# Patient Record
Sex: Female | Born: 1960
Health system: Southern US, Community
[De-identification: ages and names within clinical notes are randomized; demographics above are authoritative.]

## PROBLEM LIST (undated history)

## (undated) DIAGNOSIS — E119 Type 2 diabetes mellitus without complications: Secondary | ICD-10-CM

## (undated) DIAGNOSIS — K219 Gastro-esophageal reflux disease without esophagitis: Secondary | ICD-10-CM

## (undated) DIAGNOSIS — I422 Other hypertrophic cardiomyopathy: Secondary | ICD-10-CM

## (undated) DIAGNOSIS — I10 Essential (primary) hypertension: Secondary | ICD-10-CM

## (undated) DIAGNOSIS — E785 Hyperlipidemia, unspecified: Secondary | ICD-10-CM

## (undated) HISTORY — DX: Gastro-esophageal reflux disease without esophagitis: K21.9

## (undated) HISTORY — DX: Other hypertrophic cardiomyopathy: I42.2

## (undated) HISTORY — DX: Type 2 diabetes mellitus without complications: E11.9

## (undated) HISTORY — DX: Essential (primary) hypertension: I10

## (undated) HISTORY — PX: NO PAST SURGERIES: SHX2092

## (undated) HISTORY — DX: Hyperlipidemia, unspecified: E78.5

---

## 2003-09-21 ENCOUNTER — Encounter (INDEPENDENT_AMBULATORY_CARE_PROVIDER_SITE_OTHER): Payer: Self-pay | Admitting: Cardiology

## 2003-09-21 ENCOUNTER — Ambulatory Visit (HOSPITAL_COMMUNITY): Admission: RE | Admit: 2003-09-21 | Discharge: 2003-09-21 | Payer: Self-pay | Admitting: Family Medicine

## 2004-04-06 ENCOUNTER — Ambulatory Visit (HOSPITAL_COMMUNITY): Admission: RE | Admit: 2004-04-06 | Discharge: 2004-04-06 | Payer: Self-pay | Admitting: Family Medicine

## 2006-02-15 ENCOUNTER — Ambulatory Visit (HOSPITAL_COMMUNITY): Admission: RE | Admit: 2006-02-15 | Discharge: 2006-02-15 | Payer: Self-pay | Admitting: Family Medicine

## 2009-04-01 ENCOUNTER — Emergency Department: Payer: Self-pay | Admitting: Emergency Medicine

## 2009-04-04 ENCOUNTER — Ambulatory Visit (HOSPITAL_COMMUNITY): Admission: RE | Admit: 2009-04-04 | Discharge: 2009-04-04 | Payer: Self-pay | Admitting: Family Medicine

## 2009-09-12 ENCOUNTER — Ambulatory Visit (HOSPITAL_COMMUNITY): Admission: RE | Admit: 2009-09-12 | Discharge: 2009-09-12 | Payer: Self-pay | Admitting: Family Medicine

## 2009-09-15 ENCOUNTER — Encounter: Admission: RE | Admit: 2009-09-15 | Discharge: 2009-09-15 | Payer: Self-pay | Admitting: Family Medicine

## 2010-10-22 ENCOUNTER — Encounter: Payer: Self-pay | Admitting: Family Medicine

## 2011-09-27 ENCOUNTER — Other Ambulatory Visit (HOSPITAL_COMMUNITY): Payer: Self-pay | Admitting: Family Medicine

## 2011-09-27 DIAGNOSIS — Z139 Encounter for screening, unspecified: Secondary | ICD-10-CM

## 2011-09-27 DIAGNOSIS — Z01419 Encounter for gynecological examination (general) (routine) without abnormal findings: Secondary | ICD-10-CM

## 2011-10-01 ENCOUNTER — Ambulatory Visit (HOSPITAL_COMMUNITY): Payer: Self-pay

## 2011-10-03 ENCOUNTER — Telehealth: Payer: Self-pay

## 2011-10-03 NOTE — Telephone Encounter (Signed)
Called, [facility] for pt to return call. (Note on referral said pt was to be scheduled after 03/07/2012 for colonoscopy.

## 2011-10-08 NOTE — Telephone Encounter (Signed)
Pt left VM to call [phone] to speak with her and schedule colonoscopy. Called, [facility] to call.

## 2011-10-10 NOTE — Telephone Encounter (Signed)
Letter mailed for pt to call.  

## 2011-10-18 ENCOUNTER — Ambulatory Visit (HOSPITAL_COMMUNITY)
Admission: RE | Admit: 2011-10-18 | Discharge: 2011-10-18 | Disposition: A | Discharge status: Home / Self Care | Payer: 59 | Source: Ambulatory Visit | Attending: Family Medicine | Admitting: Family Medicine

## 2011-10-18 DIAGNOSIS — Z01419 Encounter for gynecological examination (general) (routine) without abnormal findings: Secondary | ICD-10-CM

## 2011-10-18 DIAGNOSIS — Z139 Encounter for screening, unspecified: Secondary | ICD-10-CM

## 2011-10-18 DIAGNOSIS — Z1231 Encounter for screening mammogram for malignant neoplasm of breast: Secondary | ICD-10-CM | POA: Insufficient documentation

## 2012-01-13 ENCOUNTER — Emergency Department (HOSPITAL_COMMUNITY): Payer: 59

## 2012-01-13 ENCOUNTER — Encounter (HOSPITAL_COMMUNITY): Payer: Self-pay | Admitting: *Deleted

## 2012-01-13 ENCOUNTER — Emergency Department (HOSPITAL_COMMUNITY)
Admission: EM | Admit: 2012-01-13 | Discharge: 2012-01-13 | Disposition: A | Discharge status: Home / Self Care | Payer: 59 | Attending: Emergency Medicine | Admitting: Emergency Medicine

## 2012-01-13 DIAGNOSIS — R000 Tachycardia, unspecified: Secondary | ICD-10-CM

## 2012-01-13 DIAGNOSIS — M545 Low back pain, unspecified: Secondary | ICD-10-CM | POA: Insufficient documentation

## 2012-01-13 DIAGNOSIS — B349 Viral infection, unspecified: Secondary | ICD-10-CM

## 2012-01-13 DIAGNOSIS — B9789 Other viral agents as the cause of diseases classified elsewhere: Secondary | ICD-10-CM | POA: Insufficient documentation

## 2012-01-13 DIAGNOSIS — Z79899 Other long term (current) drug therapy: Secondary | ICD-10-CM | POA: Insufficient documentation

## 2012-01-13 DIAGNOSIS — E860 Dehydration: Secondary | ICD-10-CM | POA: Insufficient documentation

## 2012-01-13 DIAGNOSIS — R509 Fever, unspecified: Secondary | ICD-10-CM | POA: Insufficient documentation

## 2012-01-13 DIAGNOSIS — R51 Headache: Secondary | ICD-10-CM | POA: Insufficient documentation

## 2012-01-13 DIAGNOSIS — I10 Essential (primary) hypertension: Secondary | ICD-10-CM | POA: Insufficient documentation

## 2012-01-13 DIAGNOSIS — R011 Cardiac murmur, unspecified: Secondary | ICD-10-CM | POA: Insufficient documentation

## 2012-01-13 DIAGNOSIS — R109 Unspecified abdominal pain: Secondary | ICD-10-CM | POA: Insufficient documentation

## 2012-01-13 LAB — DIFFERENTIAL
Basophils Absolute: 0 10*3/uL (ref 0.0–0.1)
Eosinophils Absolute: 0 10*3/uL (ref 0.0–0.7)
Lymphocytes Relative: 5 % — ABNORMAL LOW (ref 12–46)
Monocytes Absolute: 0.2 10*3/uL (ref 0.1–1.0)
Monocytes Relative: 2 % — ABNORMAL LOW (ref 3–12)
Neutrophils Relative %: 93 % — ABNORMAL HIGH (ref 43–77)

## 2012-01-13 LAB — COMPREHENSIVE METABOLIC PANEL
GFR calc Af Amer: 80 mL/min — ABNORMAL LOW (ref >90)
Glucose, Bld: 138 mg/dL — ABNORMAL HIGH (ref 70–99)
Potassium: 2.9 mEq/L — ABNORMAL LOW (ref 3.5–5.1)
Total Bilirubin: 0.5 mg/dL (ref 0.3–1.2)

## 2012-01-13 LAB — URINALYSIS, ROUTINE W REFLEX MICROSCOPIC
Hgb urine dipstick: NEGATIVE
Ketones, ur: NEGATIVE mg/dL
Leukocytes, UA: NEGATIVE

## 2012-01-13 LAB — TSH: TSH: 1.625 u[IU]/mL (ref 0.350–4.500)

## 2012-01-13 LAB — POCT I-STAT, CHEM 8
Calcium, Ion: 1.13 mmol/L (ref 1.12–1.32)
Chloride: 97 mEq/L (ref 96–112)
Glucose, Bld: 142 mg/dL — ABNORMAL HIGH (ref 70–99)
HCT: 45 % (ref 36.0–46.0)
Hemoglobin: 15.3 g/dL — ABNORMAL HIGH (ref 12.0–15.0)

## 2012-01-13 LAB — CBC
HCT: 40.3 % (ref 36.0–46.0)
Hemoglobin: 14.1 g/dL (ref 12.0–15.0)
MCV: 81.3 fL (ref 78.0–100.0)
RBC: 4.96 MIL/uL (ref 3.87–5.11)
WBC: 8.5 10*3/uL (ref 4.0–10.5)

## 2012-01-13 LAB — CK TOTAL AND CKMB (NOT AT ARMC)
CK, MB: 2.3 ng/mL (ref 0.3–4.0)
Relative Index: INVALID (ref 0.0–2.5)

## 2012-01-13 LAB — D-DIMER, QUANTITATIVE: D-Dimer, Quant: 2.56 ug/mL-FEU — ABNORMAL HIGH (ref 0.00–0.48)

## 2012-01-13 LAB — WET PREP, GENITAL: Trich, Wet Prep: NONE SEEN

## 2012-01-13 MED ORDER — HYDROMORPHONE HCL PF 1 MG/ML IJ SOLN
1.0000 mg | Freq: Once | INTRAMUSCULAR | Status: AC
  Administered 2012-01-13: 1 mg via INTRAVENOUS
  Filled 2012-01-13: qty 1

## 2012-01-13 MED ORDER — ACETAMINOPHEN 325 MG PO TABS
650.0000 mg | ORAL_TABLET | Freq: Once | ORAL | Status: AC
  Administered 2012-01-13: 650 mg via ORAL
  Filled 2012-01-13: qty 2

## 2012-01-13 MED ORDER — POTASSIUM CHLORIDE CRYS ER 20 MEQ PO TBCR
40.0000 meq | EXTENDED_RELEASE_TABLET | Freq: Once | ORAL | Status: AC
  Administered 2012-01-13: 40 meq via ORAL
  Filled 2012-01-13: qty 2

## 2012-01-13 MED ORDER — IOHEXOL 350 MG/ML SOLN
80.0000 mL | Freq: Once | INTRAVENOUS | Status: AC | PRN
  Administered 2012-01-13: 80 mL via INTRAVENOUS

## 2012-01-13 MED ORDER — KETOROLAC TROMETHAMINE 30 MG/ML IJ SOLN
30.0000 mg | Freq: Once | INTRAMUSCULAR | Status: AC
  Administered 2012-01-13: 30 mg via INTRAVENOUS
  Filled 2012-01-13: qty 1

## 2012-01-13 MED ORDER — SODIUM CHLORIDE 0.9 % IV BOLUS (SEPSIS)
500.0000 mL | Freq: Once | INTRAVENOUS | Status: AC
  Administered 2012-01-13: 500 mL via INTRAVENOUS

## 2012-01-13 MED ORDER — ONDANSETRON HCL 4 MG/2ML IJ SOLN
4.0000 mg | Freq: Once | INTRAMUSCULAR | Status: AC
  Administered 2012-01-13: 4 mg via INTRAVENOUS
  Filled 2012-01-13: qty 2

## 2012-01-13 MED ORDER — ONDANSETRON 8 MG PO TBDP: 8.0000 mg | ORAL_TABLET | Freq: Three times a day (TID) | ORAL | Status: AC | PRN

## 2012-01-13 MED ORDER — SODIUM CHLORIDE 0.9 % IV BOLUS (SEPSIS)
1000.0000 mL | INTRAVENOUS | Status: AC
  Administered 2012-01-13: 1000 mL via INTRAVENOUS

## 2012-01-13 MED ORDER — ALBUTEROL SULFATE (5 MG/ML) 0.5% IN NEBU: 5.0000 mg | INHALATION_SOLUTION | RESPIRATORY_TRACT | Status: DC | PRN

## 2012-01-13 MED ORDER — ACETAMINOPHEN 325 MG PO TABS: 650.0000 mg | ORAL_TABLET | Freq: Once | ORAL | Status: DC

## 2012-01-13 MED ORDER — SODIUM CHLORIDE 0.9 % IV SOLN
INTRAVENOUS | Status: DC
  Administered 2012-01-13: 125 mL/h via INTRAVENOUS

## 2012-01-13 MED ORDER — IOHEXOL 300 MG/ML  SOLN
100.0000 mL | Freq: Once | INTRAMUSCULAR | Status: AC | PRN
  Administered 2012-01-13: 100 mL via INTRAVENOUS

## 2012-01-13 MED ORDER — POTASSIUM CHLORIDE 10 MEQ/100ML IV SOLN
10.0000 meq | Freq: Once | INTRAVENOUS | Status: AC
  Administered 2012-01-13: 10 meq via INTRAVENOUS
  Filled 2012-01-13: qty 100

## 2012-01-13 NOTE — ED Notes (Signed)
The pt has been weak and dizzy since last pm.  She has had chills and aching all over also.  Elevated temp earlier

## 2012-01-13 NOTE — ED Notes (Signed)
Pt with fluids infusing not charted at 250 ml an hour ns.denies complaints or needs. Pt is happy with care thus far in stay hre.

## 2012-01-13 NOTE — ED Provider Notes (Signed)
History     CSN: [id number]  Arrival date & time 01/13/12  0133   First MD Initiated Contact with Patient 01/13/12 0217      Chief Complaint  Patient presents with  . Tachycardia    (Consider location/radiation/quality/duration/timing/severity/associated sxs/prior treatment) HPI  Past Medical History  Diagnosis Date  . Hypertension     History reviewed. No pertinent past surgical history.  No family history on file.  History  Substance Use Topics  . Smoking status: Never Smoker   . Smokeless tobacco: Not on file  . Alcohol Use: No    OB History    Grav Para Term Preterm Abortions TAB SAB Ect Mult Living                  Review of Systems  Allergies  Review of patient's allergies indicates no known allergies.  Home Medications   Current Outpatient Rx  Name Route Sig Dispense Refill  . VITAMIN D 1000 UNITS PO TABS Oral Take 1,000 Units by mouth daily.    . OMEGA-3 FATTY ACIDS 1000 MG PO CAPS Oral Take 1 g by mouth 2 (two) times daily.    Marland Kitchen HYDROCHLOROTHIAZIDE 25 MG PO TABS Oral Take 25 mg by mouth daily.    . TRIVORA (28) PO Oral Take 1 tablet by mouth daily.    Marland Kitchen SIMVASTATIN 10 MG PO TABS Oral Take 10 mg by mouth at bedtime.      BP 120/74  Pulse 120  Temp(Src) 99 F (37.2 C) (Oral)  Resp 18  Ht 5' (1.524 m)  Wt 160 lb (72.576 kg)  BMI 31.25 kg/m2  SpO2 99%  LMP 12/24/2011  Physical Exam  ED Course  Procedures (including critical care time)  Labs Reviewed  DIFFERENTIAL - Abnormal; Notable for the following:    Neutrophils Relative 93 (*)    Neutro Abs 7.8 (*)    Lymphocytes Relative 5 (*)    Lymphs Abs 0.4 (*)    Monocytes Relative 2 (*)    All other components within normal limits  COMPREHENSIVE METABOLIC PANEL - Abnormal; Notable for the following:    Sodium 130 (*)    Potassium 2.9 (*)    Chloride 94 (*)    Glucose, Bld 138 (*)    Albumin 3.1 (*)    GFR calc non Af Amer 69 (*)    GFR calc Af Amer 80 (*)    All other components  within normal limits  POCT I-STAT, CHEM 8 - Abnormal; Notable for the following:    Sodium 132 (*)    Potassium 3.0 (*)    Glucose, Bld 142 (*)    Hemoglobin 15.3 (*)    All other components within normal limits  WET PREP, GENITAL - Abnormal; Notable for the following:    WBC, Wet Prep HPF POC FEW (*)    All other components within normal limits  CBC  CK TOTAL AND CKMB  URINALYSIS, ROUTINE W REFLEX MICROSCOPIC  GC/CHLAMYDIA PROBE AMP, GENITAL  TSH  D-DIMER, QUANTITATIVE   Ct Abdomen Pelvis W Contrast  01/13/2012  *RADIOLOGY REPORT*  Clinical Data: Abdominal pain  CT ABDOMEN AND PELVIS WITH CONTRAST  Technique:  Multidetector CT imaging of the abdomen and pelvis was performed following the standard protocol during bolus administration of intravenous contrast.  Contrast: OMNIPAQUE IOHEXOL 300 MG/ML  SOLN  Comparison: none  Findings: Lung bases are clear.  No pericardial fluid.  No focal hepatic lesion.  The gallbladder, pancreas, spleen, adrenal  glands, and kidneys are normal.  The stomach, small bowel, appendix, and cecum are normal.  The colon and rectosigmoid colon are normal.  Abdominal aorta normal caliber.  There is a left retro aortic renal vein.  No retroperitoneal periportal lymphadenopathy.  No peritoneal disease.  There is a small nodule posterior to the left psoas muscle measuring 5 mm (image 52).  This is of undetermined significance.  No free fluid the pelvis.  The uterus and ovaries are normal.  No pelvic lymphadenopathy. Review of  bone windows demonstrates no aggressive osseous lesions.  IMPRESSION:  1.  No acute abdominal or pelvic findings. 2.  Small retroperitoneal nodule on the left.  In the absence of known malignancy this is of unlikely clinical significance.  Original Report Authenticated By: Genevive Bi, M.D.     1. Viral syndrome   2. Dehydration   3. Tachycardia     12:18 PM Handoff from Dr. Fredricka Bonine from [facility] -- patient worked up overnight for  tachycardia, fluids given. Likely viral syndrome but d-dimer and TSH pending.   Plan: If d-dimer neg, d/c home. If pos, CT angio chest and dispo per findings.   Vital signs reviewed and are as follows: Filed Vitals:   01/13/12 1200  BP: 120/74  Pulse: 120  Temp:   Resp: 18   12:42 PM D-dimer elevated. CT angio ordered. Will hydrate. Discussed with Dr. Fredricka Bonine given previous dye load.   2:56 PM CT scan reviewed by myself. No pulmonary embolism. Patient informed of results. Patient states she feels better with the exception of a headache. Patient continues to be tachycardic between 120 and 130. I discussed this with Dr. Fredricka Bonine. We discussed that dangerous etiology of tachycardia has been reasonably evaluated and the patient can follow up with her primary care physician.   Patient counseled on supportive care for symptoms and s/s to return including worsening symptoms, persistent fever, persistent vomiting, chest pain, shortness of breath or if they have any other concerns.  Urged to see PCP in one to 2 days for reevaluation. Patient verbalizes understanding and agrees with plan.    MDM  Patients with sinus tachycardia, generalized weakness and muscle aches. CT scan of abdomen is negative. CT scan of chest shows no PE. Blood tests suggest mild dehydration. Patient has received fluids in emergency department. TSH is pending. Clinically the patient states she is improved. She is requesting discharge home. Patient appears well and has been stable at time of discharge.        [place], [place] 01/13/12 [phone]

## 2012-01-13 NOTE — Discharge Instructions (Signed)
Please read and follow all provided instructions.  Your diagnoses today include:  1. Viral syndrome   2. Dehydration     Tests performed today include:  Blood tests showed mild dehydration  CT scan of your abdomen that did not show any concerning causes of your symptoms  CT scan of your chest that did not show any blood clots  Thyroid test that is still pending  Vital signs. See below for your results today.   Medications prescribed:   Zofran (ondansetron) - for nausea and vomiting  Take any prescribed medications only as directed.  Home care instructions:  Follow any educational materials contained in this packet.  Use motrin or aleve as directed on packaging for body aches.  Double your fluid intake for the next several days.  Follow-up instructions: Please follow-up with your primary care provider in the next 1-2 days for further evaluation of your symptoms. If you do not have a primary care doctor -- see below for referral information.   Return instructions:   Please return to the Emergency Department if you experience worsening symptoms.   Return with worsening chest pain, shortness of breath, persistent vomiting, persistent high fever.  Please return if you have any other emergent concerns.  Additional Information:  Your vital signs today were: BP 122/64  Pulse 125  Temp(Src) 99 F (37.2 C) (Oral)  Resp 16  Ht 5' (1.524 m)  Wt 160 lb (72.576 kg)  BMI 31.25 kg/m2  SpO2 100%  LMP 12/24/2011 If your blood pressure (BP) was elevated above 135/85 this visit, please have this repeated by your doctor within one month. -------------- No Primary Care Doctor Call [facility]  [phone] Other agencies that provide inexpensive medical care    [facility]  [phone]    [facility]  [phone]    [facility]  [phone]    [facility]  [phone]    [facility]  [phone]    [facility]   [phone] -------------- RESOURCE GUIDE:  Dental Problems  Patients with Medicaid: [facility] [phone] [address]                                            [phone] Phone:  [phone]                                                   Phone:  [phone]  If unable to pay or uninsured, contact:  [facility] or [facility]. to become qualified for the adult dental clinic.  Chronic Pain Problems Contact [facility]  [phone] Patients need to be referred by their primary care doctor.  Insufficient Money for Medicine Contact [employer]:  call "211" or [facility] [phone].  Psychological Services [facility]  [phone] [facility]  [phone] [facility]   [phone] (emergency services [phone])  Substance Abuse Resources [facility]  [phone] [facility] [phone] [facility] [phone] Floydene Flock [phone] Residential & Outpatient Substance Abuse Program  [phone]  Abuse/Neglect [facility] Child Abuse Hotline [phone] [facility] Child Abuse Hotline [phone] (After Hours)  Emergency Shelter [facility] [phone]  Maternity Homes [facility] at the [place] [facility] [place] [phone] [facility] [phone]  [facility]  [facility] [place]     [employer]                          [facility] Dept. [address] [place]                       [address]      [address]  Blondell Reveal Phone:  [phone]                                   Phone:  [phone]                 Phone:  [phone]  [facility] Phone:  [phone]  [facility] Child Abuse Hotline [phone] [phone] (After Hours)

## 2012-01-13 NOTE — ED Provider Notes (Signed)
History     CSN: [id number]  Arrival date & time 01/13/12  0133   First MD Initiated Contact with Patient 01/13/12 0217      Chief Complaint  Patient presents with  . Tachycardia    (Consider location/radiation/quality/duration/timing/severity/associated sxs/prior treatment) HPI Comments: The patient is a 51 year old female with a history of hypertension who presents with several complaints, chief among them fever, lower abdominal pain and lower back pain with a secondary headache. She admits to having a fever of 101.8 at home. Her symptoms are persistent, or gradual in onset and gradually getting worse. She denies rashes, swelling, diarrhea, dysuria, chest pain or shortness of breath or cough. Her headache is persistent, is not associated with a stiff neck, weakness, numbness or difficulty walking.  The history is provided by the patient and the spouse.    Past Medical History  Diagnosis Date  . Hypertension     History reviewed. No pertinent past surgical history.  No family history on file.  History  Substance Use Topics  . Smoking status: Never Smoker   . Smokeless tobacco: Not on file  . Alcohol Use: No    OB History    Grav Para Term Preterm Abortions TAB SAB Ect Mult Living                  Review of Systems  All other systems reviewed and are negative.    Allergies  Review of patient's allergies indicates no known allergies.  Home Medications   Current Outpatient Rx  Name Route Sig Dispense Refill  . VITAMIN D 1000 UNITS PO TABS Oral Take 1,000 Units by mouth daily.    . OMEGA-3 FATTY ACIDS 1000 MG PO CAPS Oral Take 1 g by mouth 2 (two) times daily.    Marland Kitchen HYDROCHLOROTHIAZIDE 25 MG PO TABS Oral Take 25 mg by mouth daily.    . TRIVORA (28) PO Oral Take 1 tablet by mouth daily.    Marland Kitchen SIMVASTATIN 10 MG PO TABS Oral Take 10 mg by mouth at bedtime.      BP 139/74  Pulse 166  Temp(Src) 99.9 F (37.7 C) (Oral)  Resp 20  SpO2 100%  LMP  12/24/2011  Physical Exam  Nursing note and vitals reviewed. Constitutional: She appears well-developed and well-nourished. No distress.  HENT:  Head: Normocephalic and atraumatic.  Mouth/Throat: Oropharynx is clear and moist. No oropharyngeal exudate.  Eyes: Conjunctivae and EOM are normal. Pupils are equal, round, and reactive to light. Right eye exhibits no discharge. Left eye exhibits no discharge. No scleral icterus.  Neck: Normal range of motion. Neck supple. No JVD present. No thyromegaly present.  Cardiovascular: Regular rhythm and intact distal pulses.  Exam reveals no gallop and no friction rub.   Murmur ( systolic) heard.      Tachycardia present  Pulmonary/Chest: Effort normal and breath sounds normal. No respiratory distress. She has no wheezes. She has no rales.  Abdominal: Soft. Bowel sounds are normal. She exhibits no distension and no mass. There is tenderness ( SP, RLQ , LLQ ttp, no masses, bil CVA ttp, non peritoneal, no upper abd ttp).  Genitourinary:       Pelvic exam: normal external genitalia, vulva, vagina, cervix, uterus and adnexa, VULVA: normal appearing vulva with no masses, tenderness or lesions, VAGINA: normal appearing vagina with normal color and discharge, no lesions, CERVIX: normal appearing cervix without discharge or lesions.  No adnexal masses or tenderness.  No CMT.  No bleeding, mild am't  of vag d/c   Musculoskeletal: Normal range of motion. She exhibits no edema and no tenderness.  Lymphadenopathy:    She has no cervical adenopathy.  Neurological: She is alert. Coordination normal.       Normal speech, EOMI, pupils normal, strenght and sensation normal, no droop.  Skin: Skin is warm and dry. No rash noted. No erythema.  Psychiatric: She has a normal mood and affect. Her behavior is normal.    ED Course  Procedures (including critical care time)  ED ECG REPORT   Date: 01/13/2012   Rate: 130  Rhythm: sinus tachycardia  QRS Axis: normal   Intervals: normal  ST/T Wave abnormalities: normal  Conduction Disutrbances:none  Narrative Interpretation:   Old EKG Reviewed: none available    Labs Reviewed  CBC  DIFFERENTIAL  COMPREHENSIVE METABOLIC PANEL  CK TOTAL AND CKMB  URINALYSIS, ROUTINE W REFLEX MICROSCOPIC   No results found.   No diagnosis found.    MDM  R/o infectiouis source, fever, tachycardia, headache without neuro findings, pain meds ordered, labs.  WBC normal, Na and K low, replaced, UA negative, CT pending  Repeat exam with ongoing mild to moderate SP and RLQ ttp   Change of shift - care signed out to Dr. Luiz Blare, MD 01/13/12 [phone]

## 2012-01-26 NOTE — ED Provider Notes (Signed)
Evaluation and management procedures were performed by the PA/NP/resident physician under my supervision/collaboration.   Felisa Bonier, MD 01/26/12 2026

## 2012-02-11 ENCOUNTER — Ambulatory Visit
Admission: RE | Admit: 2012-02-11 | Discharge: 2012-02-11 | Disposition: A | Discharge status: Home / Self Care | Payer: 59 | Source: Ambulatory Visit | Attending: Family Medicine | Admitting: Family Medicine

## 2012-02-11 ENCOUNTER — Other Ambulatory Visit: Payer: Self-pay | Admitting: Family Medicine

## 2012-02-11 DIAGNOSIS — M1990 Unspecified osteoarthritis, unspecified site: Secondary | ICD-10-CM

## 2012-02-11 DIAGNOSIS — R52 Pain, unspecified: Secondary | ICD-10-CM

## 2014-06-24 ENCOUNTER — Other Ambulatory Visit: Payer: Self-pay

## 2014-06-24 DIAGNOSIS — Z1231 Encounter for screening mammogram for malignant neoplasm of breast: Secondary | ICD-10-CM

## 2014-07-05 ENCOUNTER — Ambulatory Visit
Admission: RE | Admit: 2014-07-05 | Discharge: 2014-07-05 | Disposition: A | Discharge status: Home / Self Care | Payer: BC Managed Care – PPO | Source: Ambulatory Visit

## 2014-07-05 ENCOUNTER — Ambulatory Visit: Payer: 59

## 2014-07-05 DIAGNOSIS — Z1231 Encounter for screening mammogram for malignant neoplasm of breast: Secondary | ICD-10-CM

## 2016-02-08 ENCOUNTER — Other Ambulatory Visit: Payer: Self-pay | Admitting: Family Medicine

## 2016-02-08 DIAGNOSIS — Z1231 Encounter for screening mammogram for malignant neoplasm of breast: Secondary | ICD-10-CM

## 2016-02-21 ENCOUNTER — Ambulatory Visit: Payer: Self-pay

## 2016-03-01 ENCOUNTER — Ambulatory Visit
Admission: RE | Admit: 2016-03-01 | Discharge: 2016-03-01 | Disposition: A | Discharge status: Home / Self Care | Payer: 59 | Source: Ambulatory Visit | Attending: Family Medicine | Admitting: Family Medicine

## 2016-03-01 DIAGNOSIS — Z1231 Encounter for screening mammogram for malignant neoplasm of breast: Secondary | ICD-10-CM

## 2016-09-12 ENCOUNTER — Ambulatory Visit: Payer: 59 | Admitting: Nutrition

## 2016-09-12 ENCOUNTER — Telehealth: Payer: Self-pay | Admitting: Nutrition

## 2016-09-12 NOTE — Telephone Encounter (Signed)
TC to pt's home. Husband said he would contact pt and have her call and reschedule missed appt.

## 2016-09-27 ENCOUNTER — Ambulatory Visit: Payer: 59 | Admitting: Nutrition

## 2016-10-04 ENCOUNTER — Telehealth: Payer: Self-pay | Admitting: Nutrition

## 2016-10-04 NOTE — Telephone Encounter (Signed)
Pt will call back to reschedule when she gets her work schedule next week.

## 2017-01-09 ENCOUNTER — Emergency Department (HOSPITAL_COMMUNITY): Payer: Worker's Compensation

## 2017-01-09 ENCOUNTER — Emergency Department (HOSPITAL_COMMUNITY)
Admission: EM | Admit: 2017-01-09 | Discharge: 2017-01-09 | Disposition: A | Discharge status: Home / Self Care | Payer: Worker's Compensation | Attending: Emergency Medicine | Admitting: Emergency Medicine

## 2017-01-09 ENCOUNTER — Encounter (HOSPITAL_COMMUNITY): Payer: Self-pay | Admitting: Emergency Medicine

## 2017-01-09 DIAGNOSIS — E119 Type 2 diabetes mellitus without complications: Secondary | ICD-10-CM | POA: Insufficient documentation

## 2017-01-09 DIAGNOSIS — Z7984 Long term (current) use of oral hypoglycemic drugs: Secondary | ICD-10-CM | POA: Insufficient documentation

## 2017-01-09 DIAGNOSIS — Y92009 Unspecified place in unspecified non-institutional (private) residence as the place of occurrence of the external cause: Secondary | ICD-10-CM | POA: Insufficient documentation

## 2017-01-09 DIAGNOSIS — Z79899 Other long term (current) drug therapy: Secondary | ICD-10-CM | POA: Insufficient documentation

## 2017-01-09 DIAGNOSIS — S0083XA Contusion of other part of head, initial encounter: Secondary | ICD-10-CM

## 2017-01-09 DIAGNOSIS — Y990 Civilian activity done for income or pay: Secondary | ICD-10-CM | POA: Insufficient documentation

## 2017-01-09 DIAGNOSIS — S0501XA Injury of conjunctiva and corneal abrasion without foreign body, right eye, initial encounter: Secondary | ICD-10-CM | POA: Diagnosis not present

## 2017-01-09 DIAGNOSIS — Y9389 Activity, other specified: Secondary | ICD-10-CM | POA: Diagnosis not present

## 2017-01-09 DIAGNOSIS — I10 Essential (primary) hypertension: Secondary | ICD-10-CM | POA: Insufficient documentation

## 2017-01-09 DIAGNOSIS — S0990XA Unspecified injury of head, initial encounter: Secondary | ICD-10-CM | POA: Diagnosis present

## 2017-01-09 LAB — CBG MONITORING, ED: Glucose-Capillary: 94 mg/dL (ref 65–99)

## 2017-01-09 MED ORDER — FLUORESCEIN SODIUM 0.6 MG OP STRP
ORAL_STRIP | OPHTHALMIC | Status: AC
  Filled 2017-01-09: qty 1

## 2017-01-09 MED ORDER — HYDROCODONE-ACETAMINOPHEN 5-325 MG PO TABS: 1.0000 | ORAL_TABLET | Freq: Once | ORAL | Status: DC

## 2017-01-09 MED ORDER — FLUORESCEIN SODIUM 0.6 MG OP STRP
1.0000 | ORAL_STRIP | Freq: Once | OPHTHALMIC | Status: AC
  Administered 2017-01-09: 1 via OPHTHALMIC
  Filled 2017-01-09: qty 1

## 2017-01-09 MED ORDER — ERYTHROMYCIN 5 MG/GM OP OINT
TOPICAL_OINTMENT | Freq: Four times a day (QID) | OPHTHALMIC | Status: DC
  Administered 2017-01-09: 1 via OPHTHALMIC
  Filled 2017-01-09: qty 3.5

## 2017-01-09 NOTE — ED Notes (Signed)
Pt in waiting area, has been walking around; NAD noted.

## 2017-01-09 NOTE — ED Notes (Signed)
Pt sitting in waiting room speaking on phone, updated on wait.
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
## 2017-01-09 NOTE — Discharge Instructions (Signed)
Take your usual prescriptions as previously directed.  Take over the counter tylenol, as directed on packaging, as needed for discomfort. Apply moist heat or ice several times per day to the area(s) of discomfort, for 20 minutes each time. Do not fall asleep on a heating or ice pack. Call your regular medical doctor tomorrow to schedule a follow up appointment in the next 3 days. Call your eye doctor today to schedule a follow up appointment in the next 24 to 48 hours.  Return to the Emergency Department immediately if worsening.
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
## 2017-07-25 DIAGNOSIS — G4733 Obstructive sleep apnea (adult) (pediatric): Secondary | ICD-10-CM | POA: Diagnosis not present

## 2017-08-01 DIAGNOSIS — I10 Essential (primary) hypertension: Secondary | ICD-10-CM | POA: Diagnosis not present

## 2017-08-01 DIAGNOSIS — Z1389 Encounter for screening for other disorder: Secondary | ICD-10-CM | POA: Diagnosis not present

## 2017-08-01 DIAGNOSIS — E782 Mixed hyperlipidemia: Secondary | ICD-10-CM | POA: Diagnosis not present

## 2017-08-01 DIAGNOSIS — Z23 Encounter for immunization: Secondary | ICD-10-CM | POA: Diagnosis not present

## 2017-08-25 DIAGNOSIS — G4733 Obstructive sleep apnea (adult) (pediatric): Secondary | ICD-10-CM | POA: Diagnosis not present

## 2017-09-04 ENCOUNTER — Ambulatory Visit (INDEPENDENT_AMBULATORY_CARE_PROVIDER_SITE_OTHER): Payer: 59 | Admitting: Cardiology

## 2017-09-04 ENCOUNTER — Encounter: Payer: Self-pay | Admitting: Cardiology

## 2017-09-04 VITALS — BP 108/64 | HR 95 | Ht 60.0 in | Wt 164.2 lb

## 2017-09-04 DIAGNOSIS — R011 Cardiac murmur, unspecified: Secondary | ICD-10-CM | POA: Diagnosis not present

## 2017-09-04 DIAGNOSIS — R9431 Abnormal electrocardiogram [ECG] [EKG]: Secondary | ICD-10-CM | POA: Diagnosis not present

## 2017-09-04 DIAGNOSIS — Z8679 Personal history of other diseases of the circulatory system: Secondary | ICD-10-CM

## 2017-09-04 DIAGNOSIS — E782 Mixed hyperlipidemia: Secondary | ICD-10-CM

## 2017-09-04 DIAGNOSIS — I10 Essential (primary) hypertension: Secondary | ICD-10-CM | POA: Diagnosis not present

## 2017-09-04 NOTE — [place] Notes (Signed)
Cardiology Office Note  Date: 09/04/2017   ID: Beth Li, DOB 07/04/1961, MRN [id number]  PCP: Avis EpleyJackson, Samantha J, PA-C  Consulting Cardiologist: Nona DellSamuel Leya Paige, MD   Chief Complaint  Patient presents with  . Cardiac evaluation    History of Present Illness: Beth OdorCheryl A Nilsson is a 56 y.o. female referred for cardiology consultation by Ms. Jean RosenthalJackson PA-C.  No specific reason for evaluation was given in the records received.  In speaking with the patient today, she states that she was evaluated by Dr. Jacinto HalimGanji about 5 years ago and diagnosed with "heart thickening," also possibly an arrhythmia such as SVT.  She was started on diltiazem at that time.  She states that she has done well in general, but about a month ago when she started metformin, she began to feel more fatigued and short of breath with activities.  This medication was just discontinued and she is waiting to see if symptoms improve.  Otherwise, she has no exertional chest pain, no palpitations or sudden dizziness, no syncope.  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  She does not describe an unusual amount of stress.  States that she feels well in general.  I personally reviewed her ECG today which shows sinus rhythm with anteroseptal Q waves, possible old anteroseptal infarct pattern, no major change compared to remote tracing available.  I reviewed her medications which are outlined below.  Past Medical History:  Diagnosis Date  . Essential hypertension   . GERD (gastroesophageal reflux disease)   . Hyperlipidemia   . Type 2 diabetes mellitus (HCC)     Past Surgical History:  Procedure Laterality Date  . NO PAST SURGERIES      Current Outpatient Medications  Medication Sig Dispense Refill  . amphetamine-dextroamphetamine (ADDERALL) 20 MG tablet Take 20 mg by mouth daily as needed.   0  . aspirin EC 81 MG tablet Take 81 mg by mouth daily.    Marland Kitchen.  atorvastatin (LIPITOR) 40 MG tablet 1 TABLET DAILY ORAL  3  . diltiazem (CARDIZEM CD) 180 MG 24 hr capsule Take 180 mg by mouth 2 (two) times daily.    Marland Kitchen. ibuprofen (ADVIL,MOTRIN) 200 MG tablet Take 600 mg by mouth every 6 (six) hours as needed for mild pain or moderate pain.    Marland Kitchen. lisinopril-hydrochlorothiazide (PRINZIDE,ZESTORETIC) 10-12.5 MG tablet 1 TABLET ONCE EACH DAY ORAL  3  . metFORMIN (GLUCOPHAGE) 500 MG tablet Take 500 mg by mouth 2 (two) times daily with a meal.    . metoprolol tartrate (LOPRESSOR) 25 MG tablet 1 TABLET TWICE DAILY ORAL  3  . traZODone (DESYREL) 50 MG tablet TAKE 1 TABLET BY MOUTH EVERY EVENING AT BEDTIME  5  . zolpidem (AMBIEN) 10 MG tablet Take 10 mg by mouth at bedtime as needed.  5   No current facility-administered medications for this visit.    Allergies:  Patient has no known allergies.   Social History: The patient  reports that  has never smoked. she has never used smokeless tobacco. She reports that she drinks alcohol. She reports that she does not use drugs.   Family History: The patient's family history includes Congestive Heart Failure in her maternal grandmother and sister; Heart attack in her father and paternal grandmother; Heart disease in her maternal grandmother and mother.   ROS:  Please see the history of present illness. Otherwise, complete review of systems is positive for none.  All other systems are reviewed and negative.   Physical Exam: VS:  BP 108/64 (BP Location: Right Arm, Cuff Size: Normal)   Pulse 95   Ht 5' (1.524 m)   Wt 164 lb 3.2 oz (74.5 kg)   LMP 01/10/2012   SpO2 92%   BMI 32.07 kg/m , BMI Body mass index is 32.07 kg/m.  Wt Readings from Last 3 Encounters:  09/04/17 164 lb 3.2 oz (74.5 kg)  01/09/17 161 lb (73 kg)  01/13/12 160 lb (72.6 kg)    General: Overweight woman, appears comfortable at rest. HEENT: Conjunctiva and lids normal, oropharynx clear. Neck: Supple, no elevated JVP or carotid bruits, no  thyromegaly. Lungs: Clear to auscultation, nonlabored breathing at rest. Cardiac: Regular rate and rhythm, no S3, 3/6 basal systolic murmur, no pericardial rub. Abdomen: Soft, nontender, bowel sounds present, no guarding or rebound. Extremities: No pitting edema, distal pulses 2+. Skin: Warm and dry. Musculoskeletal: No kyphosis. Neuropsychiatric: Alert and oriented x3, affect grossly appropriate.  ECG: I personally reviewed the tracing from 01/13/2012 which showed sinus tachycardia with anteroseptal Q waves and ST segment changes, rule out anteroseptal infarct pattern.  Recent Labwork:  November 2018: Cholesterol 157, HDL 60, LDL 79, triglycerides 88, AST 16, ALT 13, TSH 1.01, hemoglobin 11.5, platelets 284, BUN 22, creatinine 0.98, hemoglobin A1c 6.6, potassium 4.1  Assessment and Plan:  1.  Patient referred for cardiac evaluation with possible history of hypertrophic cardiomyopathy and SVT, although no specific cardiac records are available for review as yet.  These are being requested from Dr. Jacinto HalimGanji.  He has a systolic murmur on examination which could go along with a hypertrophic cardiomyopathy, she is not aware of any associated valvular heart disease.  We will plan to obtain an echocardiogram in addition to reviewing her old records.  Based on this, follow-up can be arranged.  At this time no changes are being made to her current medications.  2.  Essential hypertension, blood pressure is very well controlled today on current medications.  3.  Hyperlipidemia, on Lipitor.  Recent LDL 79.  4.  Type 2 diabetes mellitus, recently on Glucophage with possible side effects as outlined above.  She states that this medication was discontinued.  She is being followed by PCP.  Recent hemoglobin A1c 6.6.  Current medicines were reviewed with the patient today.   Orders Placed This Encounter  Procedures  . ECHOCARDIOGRAM COMPLETE    Disposition: Call with test results and follow-up  plan.  Signed, Jonelle SidleSamuel G. Jodel Mayhall, MD, [employer] [facility] LLPFACC 09/04/2017 9:21 AM    [facility] [address], [place], [place] [address] Phone: [phone]; Fax: [phone]

## 2017-09-04 NOTE — Patient Instructions (Signed)
Medication Instructions:  Your physician recommends that you continue on your current medications as directed. Please refer to the Current Medication list given to you today.   Labwork: NONE  Testing/Procedures: Your physician has requested that you have an echocardiogram. Echocardiography is a painless test that uses sound waves to create images of your heart. It provides your doctor with information about the size and shape of your heart and how well your heart's chambers and valves are working. This procedure takes approximately one hour. There are no restrictions for this procedure.    Follow-Up: Your physician recommends that you schedule a follow-up appointment in: TO BE DETERMINED BASED ON TESTS AND RECORDS FROM PREVIOUS CARDIOLOGIST    Any Other Special Instructions Will Be Listed Below (If Applicable).     If you need a refill on your cardiac medications before your next appointment, please call your pharmacy.

## 2017-09-04 NOTE — [place] Notes (Signed)
Records received from Dr. Jacinto HalimGanji regarding previous workup in 2013 in 2014.  History does not include SVT, although she did undergo an echocardiogram in May 2013 that revealed normal LVEF with moderate LVH, mild diastolic dysfunction, and an LVOT gradient of 12 mmHg.  No aortic stenosis was found at that point.  Lexiscan Myoview from 2013 was negative for ischemia.

## 2017-09-05 NOTE — Addendum Note (Signed)
Addended by: Marlyn CorporalARLTON, Solenne Manwarren A on: 09/05/2017 08:17 AM   Modules accepted: Orders

## 2017-09-11 ENCOUNTER — Ambulatory Visit (HOSPITAL_COMMUNITY)
Admission: RE | Admit: 2017-09-11 | Discharge: 2017-09-11 | Disposition: A | Discharge status: Home / Self Care | Payer: 59 | Source: Ambulatory Visit | Attending: Cardiology | Admitting: Cardiology

## 2017-09-11 ENCOUNTER — Telehealth: Payer: Self-pay | Admitting: *Deleted

## 2017-09-11 DIAGNOSIS — I119 Hypertensive heart disease without heart failure: Secondary | ICD-10-CM | POA: Insufficient documentation

## 2017-09-11 DIAGNOSIS — E785 Hyperlipidemia, unspecified: Secondary | ICD-10-CM | POA: Insufficient documentation

## 2017-09-11 DIAGNOSIS — I080 Rheumatic disorders of both mitral and aortic valves: Secondary | ICD-10-CM | POA: Insufficient documentation

## 2017-09-11 DIAGNOSIS — K219 Gastro-esophageal reflux disease without esophagitis: Secondary | ICD-10-CM | POA: Diagnosis not present

## 2017-09-11 DIAGNOSIS — E119 Type 2 diabetes mellitus without complications: Secondary | ICD-10-CM | POA: Diagnosis not present

## 2017-09-11 DIAGNOSIS — R011 Cardiac murmur, unspecified: Secondary | ICD-10-CM | POA: Diagnosis not present

## 2017-09-11 LAB — ECHOCARDIOGRAM COMPLETE
AO mean calculated velocity dopler: 203 cm/s
AOVTI: 72.4 cm
AV pk vel: 309 cm/s
AVG: 20 mmHg
AVPG: 38 mmHg
CHL CUP RV SYS PRESS: 29 mmHg
CHL CUP STROKE VOLUME: 31 mL
CHL CUP TV REG PEAK VELOCITY: 254 cm/s
EERAT: 7.7
EWDT: 556 ms
FS: 60 % — AB (ref 28–44)
IV/PV OW: 1.85
LA diam end sys: 40 mm
LA diam index: 2.21 cm/m2
LA vol A4C: 77.2 ml
LA vol: 61.2 mL
LASIZE: 40 mm
LAVOLIN: 33.9 mL/m2
LDCA: 2.27 cm2
LV E/e'average: 7.7
LV TDI E'LATERAL: 7.18
LV TDI E'MEDIAL: 3.92
LV dias vol index: 25 mL/m2
LVDIAVOL: 45 mL — AB (ref 46–106)
LVEEMED: 7.7
LVELAT: 7.18 cm/s
LVOTD: 17 mm
LVSYSVOL: 14 mL
LVSYSVOLIN: 8 mL/m2
Lateral S' vel: 15.8 cm/s
MV Dec: 556
MV pk A vel: 107 m/s
MV pk E vel: 55.3 m/s
PW: 11.9 mm — AB (ref 0.6–1.1)
RV TAPSE: 27.7 mm
Simpson's disk: 69
TR max vel: 254 cm/s

## 2017-09-11 NOTE — [place] Notes (Signed)
*  PRELIMINARY RESULTS* Echocardiogram 2D Echocardiogram has been performed.  Stacey DrainWhite, Dusten Ellinwood J 09/11/2017, 10:06 AM

## 2017-09-11 NOTE — Telephone Encounter (Signed)
Called patient with test results. No answer. Left message to call back.  

## 2017-09-11 NOTE — Telephone Encounter (Signed)
-----   Message from Jonelle SidleSamuel G McDowell, MD sent at 09/11/2017 11:29 AM EST ----- Results reviewed.  Patient has a hypertrophic cardiomyopathy as suspected at the recent office visit and also review of her previous records.  I reviewed the above report.  Please schedule office follow-up within the next 6 months, sooner if symptoms worsen. A copy of this test should be forwarded to Avis EpleyJackson, Samantha J, PA-C.

## 2017-09-24 DIAGNOSIS — G4733 Obstructive sleep apnea (adult) (pediatric): Secondary | ICD-10-CM | POA: Diagnosis not present

## 2017-10-25 DIAGNOSIS — G4733 Obstructive sleep apnea (adult) (pediatric): Secondary | ICD-10-CM | POA: Diagnosis not present

## 2017-11-21 DIAGNOSIS — E119 Type 2 diabetes mellitus without complications: Secondary | ICD-10-CM | POA: Diagnosis not present

## 2017-11-21 DIAGNOSIS — E782 Mixed hyperlipidemia: Secondary | ICD-10-CM | POA: Diagnosis not present

## 2017-11-21 DIAGNOSIS — I10 Essential (primary) hypertension: Secondary | ICD-10-CM | POA: Diagnosis not present

## 2017-11-25 DIAGNOSIS — J019 Acute sinusitis, unspecified: Secondary | ICD-10-CM | POA: Diagnosis not present

## 2017-11-25 DIAGNOSIS — G4733 Obstructive sleep apnea (adult) (pediatric): Secondary | ICD-10-CM | POA: Diagnosis not present

## 2017-11-25 DIAGNOSIS — E663 Overweight: Secondary | ICD-10-CM | POA: Diagnosis not present

## 2017-11-25 DIAGNOSIS — R6889 Other general symptoms and signs: Secondary | ICD-10-CM | POA: Diagnosis not present

## 2017-11-25 DIAGNOSIS — Z6827 Body mass index (BMI) 27.0-27.9, adult: Secondary | ICD-10-CM | POA: Diagnosis not present

## 2017-12-23 DIAGNOSIS — G4733 Obstructive sleep apnea (adult) (pediatric): Secondary | ICD-10-CM | POA: Diagnosis not present

## 2018-01-23 DIAGNOSIS — G4733 Obstructive sleep apnea (adult) (pediatric): Secondary | ICD-10-CM | POA: Diagnosis not present

## 2018-02-22 DIAGNOSIS — G4733 Obstructive sleep apnea (adult) (pediatric): Secondary | ICD-10-CM | POA: Diagnosis not present

## 2018-04-25 DIAGNOSIS — Z6827 Body mass index (BMI) 27.0-27.9, adult: Secondary | ICD-10-CM | POA: Diagnosis not present

## 2018-04-25 DIAGNOSIS — E663 Overweight: Secondary | ICD-10-CM | POA: Diagnosis not present

## 2018-04-25 DIAGNOSIS — M25562 Pain in left knee: Secondary | ICD-10-CM | POA: Diagnosis not present

## 2018-05-05 DIAGNOSIS — M25562 Pain in left knee: Secondary | ICD-10-CM | POA: Diagnosis not present

## 2018-05-08 ENCOUNTER — Encounter: Payer: Self-pay | Admitting: Gastroenterology

## 2018-05-15 ENCOUNTER — Ambulatory Visit: Payer: Self-pay | Admitting: Cardiology

## 2018-06-05 DIAGNOSIS — Z0001 Encounter for general adult medical examination with abnormal findings: Secondary | ICD-10-CM | POA: Diagnosis not present

## 2018-06-05 DIAGNOSIS — Z0000 Encounter for general adult medical examination without abnormal findings: Secondary | ICD-10-CM | POA: Diagnosis not present

## 2018-06-05 DIAGNOSIS — Z6827 Body mass index (BMI) 27.0-27.9, adult: Secondary | ICD-10-CM | POA: Diagnosis not present

## 2018-06-05 DIAGNOSIS — E663 Overweight: Secondary | ICD-10-CM | POA: Diagnosis not present

## 2018-06-26 NOTE — Progress Notes (Signed)
Cardiology Office Note  Date: 06/27/2018   ID: Beth Li, DOB 04/04/1961, MRN [id number]  PCP: Avis Epley, PA-C  Primary Cardiologist: Nona Dell, MD   Chief Complaint  Patient presents with  . Cardiomyopathy    History of Present Illness: Beth Li is a 57 y.o. female seen in consultation back in December 2018.  She presents for a routine follow-up visit.  She has a hypertrophic cardiomyopathy resulting in heart murmur, confirmed by echocardiogram obtained late last year.  Echocardiogram from December 2018 showed severe LVH with LVEF 65 to 70% and septal asymmetry associated with SAM and dynamic LVOT gradient, overall mild at rest, grade 1 diastolic dysfunction, and severe left atrial enlargement.  States that she has had intermittent shortness of breath with activity.  Heart rate was elevated today when she came in, tends to run a low normal blood pressure as well.  I talked with her about the results of her echocardiogram and also medication adjustments aimed more at heart rate control which should help her symptomatically.  We also discussed maintaining adequate hydration as LVOT gradient will worsen in setting of low volume state.  She has had no syncope or chest pain.  Past Medical History:  Diagnosis Date  . Essential hypertension   . GERD (gastroesophageal reflux disease)   . Hyperlipidemia   . Type 2 diabetes mellitus (HCC)     Past Surgical History:  Procedure Laterality Date  . NO PAST SURGERIES      Current Outpatient Medications  Medication Sig Dispense Refill  . amphetamine-dextroamphetamine (ADDERALL) 20 MG tablet Take 20 mg by mouth daily as needed.   0  . aspirin EC 81 MG tablet Take 81 mg by mouth daily.    Marland Kitchen atorvastatin (LIPITOR) 40 MG tablet 1 TABLET DAILY ORAL  3  . diltiazem (CARDIZEM CD) 180 MG 24 hr capsule Take 180 mg by mouth 2 (two) times daily.    Marland Kitchen FARXIGA 10 MG TABS tablet     . ibuprofen (ADVIL,MOTRIN) 200 MG  tablet Take 600 mg by mouth every 6 (six) hours as needed for mild pain or moderate pain.    Marland Kitchen ibuprofen (ADVIL,MOTRIN) 800 MG tablet TAKE 1 TABLET BY MOUTH THREE TIMES A DAY FOR 10 DAYS  1  . traZODone (DESYREL) 50 MG tablet TAKE 1 TABLET BY MOUTH EVERY EVENING AT BEDTIME  5  . zolpidem (AMBIEN) 10 MG tablet Take 10 mg by mouth at bedtime as needed.  5  . metoprolol tartrate (LOPRESSOR) 50 MG tablet Take 1 tablet (50 mg total) by mouth 2 (two) times daily. 180 tablet 3   No current facility-administered medications for this visit.    Allergies:  Patient has no known allergies.   Social History: The patient  reports that she has never smoked. She has never used smokeless tobacco. She reports that she drinks alcohol. She reports that she does not use drugs.   ROS:  Please see the history of present illness. Otherwise, complete review of systems is positive for none.  All other systems are reviewed and negative.   Physical Exam: VS:  BP 106/69   Pulse (!) 110   Ht 5' (1.524 m)   Wt 167 lb (75.8 kg)   LMP 01/10/2012   SpO2 95%   BMI 32.61 kg/m , BMI Body mass index is 32.61 kg/m.  Wt Readings from Last 3 Encounters:  06/27/18 167 lb (75.8 kg)  09/04/17 164 lb 3.2 oz (74.5 kg)  01/09/17 161 lb (73 kg)    General: Patient appears comfortable at rest. HEENT: Conjunctiva and lids normal, oropharynx clear. Neck: Supple, no elevated JVP or carotid bruits, no thyromegaly. Lungs: Clear to auscultation, nonlabored breathing at rest. Cardiac: Regular rate and rhythm, no S3, 3/6 systolic murmur, no pericardial rub. Abdomen: Soft, nontender, bowel sounds present. Extremities: No pitting edema, distal pulses 2+. Skin: Warm and dry. Musculoskeletal: No kyphosis. Neuropsychiatric: Alert and oriented x3, affect grossly appropriate.  ECG: I personally reviewed the tracing from 09/04/2017 which shows sinus rhythm with septal Q waves.  Recent Labwork:  November 2018: Cholesterol 157, HDL 60,  LDL 79, triglycerides 88, AST 16, ALT 13, TSH 1.01, hemoglobin 11.5, platelets 284, BUN 22, creatinine 0.98, hemoglobin A1c 6.6, potassium 4.1  Other Studies Reviewed Today:  Echocardiogram 09/11/2017: Study Conclusions  - Left ventricle: The cavity size was normal. Wall thickness was   increased in a pattern of severe LVH. Systolic function was   vigorous. The estimated ejection fraction was in the range of 65%   to 70%. There is asymmetrical septal hypertrophy with SAM and a   dynamic obstructive gradient consistent with hypertrophic   obstructive cardiomyopathy. Technically unable to measure peak   gradient, appears mild. Wall motion was normal; there were no   regional wall motion abnormalities. Doppler parameters are   consistent with abnormal left ventricular relaxation (grade 1   diastolic dysfunction). - Aortic valve: Mildly calcified annulus. Trileaflet; mildly   thickened leaflets. - Mitral valve: Mildly calcified annulus. Mildly thickened leaflets. - Left atrium: The atrium was severely dilated. - Atrial septum: No defect or patent foramen ovale was identified. - Systemic veins: IVC is small, suggesting low RA pressure and   hypovolemia.  Assessment and Plan:  1.  Hypertrophic cardiomyopathy, follow-up echocardiogram from December 2018 is reviewed above.  She is symptomatic with intermittent dyspnea on exertion.  Today we reviewed her medications and will plan to stop Prinzide completely.  Increase Lopressor to 50 mg twice daily and otherwise continue current dose of Cardizem CD.  We also went over adequate hydration.  Office follow-up arranged.  2.  Mixed hyperlipidemia, on Lipitor.  Keep follow-up with PCP, last LDL was 79.  3.  History of essential hypertension.  Try to manage with medications that also address heart rate control and avoid overdiuresis in light of her hypertrophic cardiomyopathy.  Current medicines were reviewed with the patient today.   Orders  Placed This Encounter  Procedures  . EKG 12-Lead    Disposition: Follow-up in 4 to 6 weeks.  Signed, Jonelle Sidle, MD, [facility] 06/27/2018 10:04 AM    [facility] [address] [place], [place] [address] Phone: [phone]; Fax: [phone]

## 2018-06-27 ENCOUNTER — Ambulatory Visit (INDEPENDENT_AMBULATORY_CARE_PROVIDER_SITE_OTHER): Payer: 59 | Admitting: Cardiology

## 2018-06-27 ENCOUNTER — Encounter

## 2018-06-27 ENCOUNTER — Encounter: Payer: Self-pay | Admitting: Cardiology

## 2018-06-27 VITALS — BP 106/69 | HR 110 | Ht 60.0 in | Wt 167.0 lb

## 2018-06-27 DIAGNOSIS — I421 Obstructive hypertrophic cardiomyopathy: Secondary | ICD-10-CM | POA: Diagnosis not present

## 2018-06-27 DIAGNOSIS — I10 Essential (primary) hypertension: Secondary | ICD-10-CM | POA: Diagnosis not present

## 2018-06-27 DIAGNOSIS — E782 Mixed hyperlipidemia: Secondary | ICD-10-CM | POA: Diagnosis not present

## 2018-06-27 MED ORDER — METOPROLOL TARTRATE 50 MG PO TABS: 50.0000 mg | ORAL_TABLET | Freq: Two times a day (BID) | ORAL | 3 refills | Status: DC

## 2018-06-27 NOTE — Patient Instructions (Addendum)
Your physician wants you to follow-up in: 4-6 weeks with [place] PA-C   STOP Prinzide   INCREASE Lopressor to 50 mg twice a day      If you need a refill on your cardiac medications before your next appointment, please call your pharmacy.      No lab work or tests ordered today.       Thank you for choosing [facility] !

## 2018-08-08 ENCOUNTER — Ambulatory Visit (INDEPENDENT_AMBULATORY_CARE_PROVIDER_SITE_OTHER): Payer: 59 | Admitting: Student

## 2018-08-08 ENCOUNTER — Encounter: Payer: Self-pay | Admitting: Student

## 2018-08-08 VITALS — BP 118/68 | HR 96 | Ht 60.0 in | Wt 172.4 lb

## 2018-08-08 DIAGNOSIS — I10 Essential (primary) hypertension: Secondary | ICD-10-CM | POA: Diagnosis not present

## 2018-08-08 DIAGNOSIS — E782 Mixed hyperlipidemia: Secondary | ICD-10-CM | POA: Diagnosis not present

## 2018-08-08 DIAGNOSIS — I422 Other hypertrophic cardiomyopathy: Secondary | ICD-10-CM

## 2018-08-08 NOTE — Progress Notes (Signed)
Cardiology Office Note    Date:  08/08/2018   ID:  Beth Li, DOB [birth year], MRN [id number]  PCP:  Avis Epley, PA-C  Cardiologist: Nona Dell, MD    Chief Complaint  Patient presents with  . Follow-up    6 week visit    History of Present Illness:    Beth Li is a 57 y.o. female with past medical history of hypertrophic cardiomyopathy, HTN, HLD, and Type II DM who presents to the office today for 6-week follow-up.  She was last examined by Dr. Diona Browner on 06/27/2018 and reported intermittent episodes of dyspnea on exertion. The importance of maintaining adequate hydration was reviewed in the setting of her cardiomyopathy. Prinzide was discontinued at the time of her office visit and Lopressor was titrated to 50 mg twice daily. She was continued on Cardizem CD 180 mg twice daily.  In talking with the patient today, she reports significant improvement in her respiratory status since her last office visit and the adjustment of her medications at that time. Denies any recurrent dyspnea on exertion. No recent orthopnea, PND, lower extremity edema, chest discomfort, or palpitations. She does follow her heart rate at home on her smart watch and says that her heart rate typically ranges from the 80's to low 100's. No recurrence in the 130's to 140's since titration of Lopressor.  She also feels that her fatigue significantly improved with the discontinuation of Prinzide. She has been consuming over 60 ounces of water per day and has not experienced any notable fluid retention.   Past Medical History:  Diagnosis Date  . Essential hypertension   . GERD (gastroesophageal reflux disease)   . Hyperlipidemia   . Hypertrophic cardiomyopathy (HCC)   . Type 2 diabetes mellitus (HCC)     Past Surgical History:  Procedure Laterality Date  . NO PAST SURGERIES      Current Medications: Outpatient Medications Prior to Visit  Medication Sig Dispense Refill  .  amphetamine-dextroamphetamine (ADDERALL) 20 MG tablet Take 20 mg by mouth daily as needed.   0  . aspirin EC 81 MG tablet Take 81 mg by mouth daily.    Marland Kitchen atorvastatin (LIPITOR) 40 MG tablet 1 TABLET DAILY ORAL  3  . diltiazem (CARDIZEM CD) 180 MG 24 hr capsule Take 180 mg by mouth 2 (two) times daily.    Marland Kitchen FARXIGA 10 MG TABS tablet     . ibuprofen (ADVIL,MOTRIN) 200 MG tablet Take 600 mg by mouth every 6 (six) hours as needed for mild pain or moderate pain.    . metoprolol tartrate (LOPRESSOR) 50 MG tablet Take 1 tablet (50 mg total) by mouth 2 (two) times daily. 180 tablet 3  . traZODone (DESYREL) 100 MG tablet TAKE 1 TABLET BY MOUTH EVERYDAY AT BEDTIME  3  . zolpidem (AMBIEN) 10 MG tablet Take 10 mg by mouth at bedtime as needed.  5  . traZODone (DESYREL) 50 MG tablet TAKE 1 TABLET BY MOUTH EVERY EVENING AT BEDTIME  5  . ibuprofen (ADVIL,MOTRIN) 800 MG tablet TAKE 1 TABLET BY MOUTH THREE TIMES A DAY FOR 10 DAYS  1   No facility-administered medications prior to visit.      Allergies:   Patient has no known allergies.   Social History   Socioeconomic History  . Marital status: Married    Spouse name: Not on file  . Number of children: Not on file  . Years of education: Not on file  . Highest  education level: Not on file  Occupational History  . Not on file  Social Needs  . Financial resource strain: Not on file  . Food insecurity:    Worry: Not on file    Inability: Not on file  . Transportation needs:    Medical: Not on file    Non-medical: Not on file  Tobacco Use  . Smoking status: Never Smoker  . Smokeless tobacco: Never Used  Substance and Sexual Activity  . Alcohol use: Not Currently    Comment: Occasional  . Drug use: No  . Sexual activity: Not on file  Lifestyle  . Physical activity:    Days per week: Not on file    Minutes per session: Not on file  . Stress: Not on file  Relationships  . Social connections:    Talks on phone: Not on file    Gets together:  Not on file    Attends religious service: Not on file    Active member of club or organization: Not on file    Attends meetings of clubs or organizations: Not on file    Relationship status: Not on file  Other Topics Concern  . Not on file  Social History Narrative  . Not on file     Family History:  The patient's family history includes Congestive Heart Failure in her maternal grandmother and sister; Heart attack in her father and paternal grandmother; Heart disease in her maternal grandmother and mother.   Review of Systems:   Please see the history of present illness.     General:  No chills, fever, night sweats or weight changes.  Cardiovascular:  No chest pain,  edema, orthopnea, palpitations, paroxysmal nocturnal dyspnea. Positive for dyspnea on exertion (now resolved).  Dermatological: No rash, lesions/masses Respiratory: No cough, dyspnea Urologic: No hematuria, dysuria Abdominal:   No nausea, vomiting, diarrhea, bright red blood per rectum, melena, or hematemesis Neurologic:  No visual changes, wkns, changes in mental status. All other systems reviewed and are otherwise negative except as noted above.   Physical Exam:    VS:  BP 118/68   Pulse 96   Ht 5' (1.524 m)   Wt 172 lb 6.4 oz (78.2 kg)   LMP 01/10/2012   SpO2 98%   BMI 33.67 kg/m    General: Well developed, well nourished [place] American female appearing in no acute distress. Head: Normocephalic, atraumatic, sclera non-icteric, no xanthomas, nares are without discharge.  Neck: No carotid bruits. JVD not elevated.  Lungs: Respirations regular and unlabored, without wheezes or rales.  Heart: Regular rate and rhythm. No S3 or S4.  No rubs or gallops appreciated. 2/6 SEM. Abdomen: Soft, non-tender, non-distended with normoactive bowel sounds. No hepatomegaly. No rebound/guarding. No obvious abdominal masses. Msk:  Strength and tone appear normal for age. No joint deformities or effusions. Extremities: No  clubbing or cyanosis. No lower extremity edema.  Distal pedal pulses are 2+ bilaterally. Neuro: Alert and oriented X 3. Moves all extremities spontaneously. No focal deficits noted. Psych:  Responds to questions appropriately with a normal affect. Skin: No rashes or lesions noted  Wt Readings from Last 3 Encounters:  08/08/18 172 lb 6.4 oz (78.2 kg)  06/27/18 167 lb (75.8 kg)  09/04/17 164 lb 3.2 oz (74.5 kg)     Studies/Labs Reviewed:   EKG:  EKG is not ordered today.    Recent Labs: No results found for requested labs within last 8760 hours.   Lipid Panel No results found  for: CHOL, TRIG, HDL, CHOLHDL, VLDL, LDLCALC, LDLDIRECT  Additional studies/ records that were reviewed today include:   Echocardiogram: 08/2017  - Left ventricle: The cavity size was normal. Wall thickness was   increased in a pattern of severe LVH. Systolic function was   vigorous. The estimated ejection fraction was in the range of 65%   to 70%. There is asymmetrical septal hypertrophy with SAM and a   dynamic obstructive gradient consistent with hypertrophic   obstructive cardiomyopathy. Technically unable to measure peak   gradient, appears mild. Wall motion was normal; there were no   regional wall motion abnormalities. Doppler parameters are   consistent with abnormal left ventricular relaxation (grade 1   diastolic dysfunction). - Aortic valve: Mildly calcified annulus. Trileaflet; mildly   thickened leaflets. - Mitral valve: Mildly calcified annulus. Mildly thickened leaflets   . - Left atrium: The atrium was severely dilated. - Atrial septum: No defect or patent foramen ovale was identified. - Systemic veins: IVC is small, suggesting low RA pressure and   hypovolemia.   Assessment:    1. Hypertrophic cardiomyopathy (HCC)   2. Essential hypertension   3. Mixed hyperlipidemia      Plan:   In order of problems listed above:  1. Hypertrophic Cardiomyopathy - The patient has a  history of hypertrophic cardiomyopathy dating back to 2013 as mentioned in prior notes. Was previously experiencing episodes of dyspnea on exertion but this has since resolved with titration of Lopressor. She reports significant improvement in her respiratory status and fatigue. HR and BP have also improved when checked in the ambulatory setting and remain well-controlled at 96 bpm and 118/68 during today's visit. - Will continue Cardizem and Lopressor at current dosing. We reviewed that she can take an extra Lopressor tablet if needed for palpitations. If finding that she is having to do this regularly, she should make our office aware as Lopressor could be further titrated to 75 mg twice daily.  2. HTN - BP is well controlled at 118/68 during today's visit. Continue Cardizem CD 180 mg twice daily and Lopressor 50 mg twice daily.  3. HLD - Followed by her PCP.  She remains on Atorvastatin 40 mg daily.  Medication Adjustments/Labs and Tests Ordered: Current medicines are reviewed at length with the patient today.  Concerns regarding medicines are outlined above.  Medication changes, Labs and Tests ordered today are listed in the Patient Instructions below. Patient Instructions  Medication Instructions:  Your physician recommends that you continue on your current medications as directed. Please refer to the Current Medication list given to you today.  May take an extra Lopressor for Palpitations.   If you need a refill on your cardiac medications before your next appointment, please call your pharmacy.   Lab work: NONE  If you have labs (blood work) drawn today and your tests are completely normal, you will receive your results only by: Marland Kitchen MyChart Message (if you have MyChart) OR . A paper copy in the mail If you have any lab test that is abnormal or we need to change your treatment, we will call you to review the results.  Testing/Procedures: NONE   Follow-Up: At [facility], you and  your health needs are our priority.  As part of our continuing mission to provide you with exceptional heart care, we have created designated Provider Care Teams.  These Care Teams include your primary Cardiologist (physician) and Advanced Practice Providers (APPs -  Physician Assistants and Nurse Practitioners) who  all work together to provide you with the care you need, when you need it. You will need a follow up appointment in 6 months.  Please call our office 2 months in advance to schedule this appointment.  You may see Nona Dell, MD or one of the following Advanced Practice Providers on your designated Care Team:   Randall An, PA-C [facility]) . Jacolyn Reedy, PA-C [facility])  Any Other Special Instructions Will Be Listed Below (If Applicable). Thank you for choosing [facility]!     Signed, Ellsworth Lennox, PA-C  08/08/2018 4:00 PM    [facility] [address] [place], [place] [address] Phone: [phone]

## 2018-08-08 NOTE — Patient Instructions (Signed)
Medication Instructions:  Your physician recommends that you continue on your current medications as directed. Please refer to the Current Medication list given to you today.  May take an extra Lopressor for Palpitations.   If you need a refill on your cardiac medications before your next appointment, please call your pharmacy.   Lab work: NONE  If you have labs (blood work) drawn today and your tests are completely normal, you will receive your results only by: Marland Kitchen MyChart Message (if you have MyChart) OR . A paper copy in the mail If you have any lab test that is abnormal or we need to change your treatment, we will call you to review the results.  Testing/Procedures: NONE   Follow-Up: At [facility], you and your health needs are our priority.  As part of our continuing mission to provide you with exceptional heart care, we have created designated Provider Care Teams.  These Care Teams include your primary Cardiologist (physician) and Advanced Practice Providers (APPs -  Physician Assistants and Nurse Practitioners) who all work together to provide you with the care you need, when you need it. You will need a follow up appointment in 6 months.  Please call our office 2 months in advance to schedule this appointment.  You may see Nona Dell, MD or one of the following Advanced Practice Providers on your designated Care Team:   Randall An, PA-C [facility]) . Jacolyn Reedy, PA-C [facility])  Any Other Special Instructions Will Be Listed Below (If Applicable). Thank you for choosing [facility]!

## 2018-08-12 ENCOUNTER — Ambulatory Visit: Payer: 59 | Admitting: Gastroenterology

## 2018-12-26 DIAGNOSIS — G4700 Insomnia, unspecified: Secondary | ICD-10-CM | POA: Diagnosis not present

## 2018-12-26 DIAGNOSIS — E1165 Type 2 diabetes mellitus with hyperglycemia: Secondary | ICD-10-CM | POA: Diagnosis not present

## 2018-12-26 DIAGNOSIS — E7849 Other hyperlipidemia: Secondary | ICD-10-CM | POA: Diagnosis not present

## 2019-02-09 ENCOUNTER — Ambulatory Visit: Payer: Self-pay | Admitting: Cardiology

## 2019-04-08 ENCOUNTER — Ambulatory Visit: Payer: 59 | Admitting: Cardiology

## 2019-04-23 ENCOUNTER — Other Ambulatory Visit: Payer: Self-pay

## 2019-04-23 DIAGNOSIS — Z20822 Contact with and (suspected) exposure to covid-19: Secondary | ICD-10-CM

## 2019-04-27 LAB — NOVEL CORONAVIRUS, NAA: SARS-CoV-2, NAA: NOT DETECTED

## 2019-06-09 ENCOUNTER — Other Ambulatory Visit: Payer: Self-pay

## 2019-06-09 ENCOUNTER — Encounter: Payer: Self-pay | Admitting: Cardiology

## 2019-06-09 ENCOUNTER — Telehealth (INDEPENDENT_AMBULATORY_CARE_PROVIDER_SITE_OTHER): Payer: 59 | Admitting: Cardiology

## 2019-06-09 VITALS — BP 130/75 | HR 98 | Ht 60.0 in | Wt 168.0 lb

## 2019-06-09 DIAGNOSIS — I422 Other hypertrophic cardiomyopathy: Secondary | ICD-10-CM | POA: Diagnosis not present

## 2019-06-09 DIAGNOSIS — I10 Essential (primary) hypertension: Secondary | ICD-10-CM

## 2019-06-09 DIAGNOSIS — E782 Mixed hyperlipidemia: Secondary | ICD-10-CM | POA: Diagnosis not present

## 2019-06-09 NOTE — Patient Instructions (Signed)
Medication Instructions: Your physician recommends that you continue on your current medications as directed. Please refer to the Current Medication list given to you today.   Labwork: None today  Procedures/Testing: Your physician has requested that you have an echocardiogram. Echocardiography is a painless test that uses sound waves to create images of your heart. It provides your doctor with information about the size and shape of your heart and how well your heart's chambers and valves are working. This procedure takes approximately one hour. There are no restrictions for this procedure.    Follow-Up: 6 months with Dr.McDowell  Any Additional Special Instructions Will Be Listed Below (If Applicable).     If you need a refill on your cardiac medications before your next appointment, please call your pharmacy.      Thank you for choosing [facility] !        

## 2019-06-09 NOTE — Progress Notes (Signed)
Virtual Visit via Telephone Note   This visit type was conducted due to national recommendations for restrictions regarding the COVID-19 Pandemic (e.g. social distancing) in an effort to limit this patient's exposure and mitigate transmission in our community.  Due to her co-morbid illnesses, this patient is at least at moderate risk for complications without adequate follow up.  This format is felt to be most appropriate for this patient at this time.  The patient did not have access to video technology/had technical difficulties with video requiring transitioning to audio format only (telephone).  All issues noted in this document were discussed and addressed.  No physical exam could be performed with this format.  Please refer to the patient's chart for her  consent to [facility].   Date:  06/09/2019   ID:  Beth Li, DOB 06/12/1961, MRN [id number]  Patient Location: Home Provider Location: Office  PCP:  Avis EpleyJackson, Samantha J, PA-C  Cardiologist:  Nona DellSamuel Jaryn Rosko, MD Electrophysiologist:  None   Evaluation Performed:  Follow-Up Visit  Chief Complaint:   Cardiac follow-up  History of Present Illness:    Beth OdorCheryl A Linzy is a 58 y.o. female last seen in November 2019 by Ms. Strader PA-C.  We spoke by phone today.  She tells me that she has felt better in terms of dyspnea on exertion following up titration of beta-blocker.  She also remains on Cardizem CD.  She walks her dog about a mile to a mile and a half each day.  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  Her last echocardiogram was in December 2018.  Was 65 to 70% with asymmetric septal hypertrophy and SAM consistent with hypertrophic cardiomyopathy.  I reviewed her medications which are outlined below.  She reports no intolerances.  Continues to follow with PCP as well.  The patient does not have symptoms concerning for COVID-19 infection (fever, chills, cough, or new shortness of breath).     Past Medical History:  Diagnosis Date  . Essential hypertension   . GERD (gastroesophageal reflux disease)   . Hyperlipidemia   . Hypertrophic cardiomyopathy (HCC)   . Type 2 diabetes mellitus (HCC)    Past Surgical History:  Procedure Laterality Date  . NO PAST SURGERIES       Current Meds  Medication Sig  . amphetamine-dextroamphetamine (ADDERALL) 20 MG tablet Take 20 mg by mouth daily as needed.   Marland Kitchen. aspirin EC 81 MG tablet Take 81 mg by mouth daily.  Marland Kitchen. atorvastatin (LIPITOR) 40 MG tablet 1 TABLET DAILY ORAL  . diltiazem (CARDIZEM CD) 180 MG 24 hr capsule Take 180 mg by mouth 2 (two) times daily.  Marland Kitchen. FARXIGA 10 MG TABS tablet Take 10 mg by mouth daily.   Marland Kitchen. ibuprofen (ADVIL,MOTRIN) 200 MG tablet Take 600 mg by mouth every 6 (six) hours as needed for mild pain or moderate pain.  . metoprolol tartrate (LOPRESSOR) 50 MG tablet Take 1 tablet (50 mg total) by mouth 2 (two) times daily.  . traZODone (DESYREL) 100 MG tablet TAKE 1 TABLET BY MOUTH EVERYDAY AT BEDTIME  . zolpidem (AMBIEN) 10 MG tablet Take 10 mg by mouth at bedtime as needed.     Allergies:   Patient has no known allergies.   Social History   Tobacco Use  . Smoking status: Never Smoker  . Smokeless tobacco: Never Used  Substance Use Topics  . Alcohol use: Not Currently    Comment: Occasional  . Drug use: No  Family Hx: The patient's family history includes Congestive Heart Failure in her maternal grandmother and sister; Heart attack in her father and paternal grandmother; Heart disease in her maternal grandmother and mother.  ROS:   Please see the history of present illness. All other systems reviewed and are negative.   Prior CV studies:   The following studies were reviewed today:  Echocardiogram 09/11/2017: Study Conclusions  - Left ventricle: The cavity size was normal. Wall thickness was   increased in a pattern of severe LVH. Systolic function was   vigorous. The estimated ejection fraction  was in the range of 65%   to 70%. There is asymmetrical septal hypertrophy with SAM and a   dynamic obstructive gradient consistent with hypertrophic   obstructive cardiomyopathy. Technically unable to measure peak   gradient, appears mild. Wall motion was normal; there were no   regional wall motion abnormalities. Doppler parameters are   consistent with abnormal left ventricular relaxation (grade 1   diastolic dysfunction). - Aortic valve: Mildly calcified annulus. Trileaflet; mildly   thickened leaflets. - Mitral valve: Mildly calcified annulus. Mildly thickened leaflets. - Left atrium: The atrium was severely dilated. - Atrial septum: No defect or patent foramen ovale was identified. - Systemic veins: IVC is small, suggesting low RA pressure and   hypovolemia.   Labs/Other Tests and Data Reviewed:    EKG:  An ECG dated 06/27/2018 was personally reviewed today and demonstrated:  Sinus tachycardia with nonspecific ST changes.  Recent Labs:  November 2018: Cholesterol 157, HDL 60, LDL 79, triglycerides 88, AST 16, ALT 13, TSH 1.01, hemoglobin 11.5, platelets 284, BUN 22, creatinine 0.98, hemoglobin A1c 6.6, potassium 4.1  Wt Readings from Last 3 Encounters:  06/09/19 168 lb (76.2 kg)  08/08/18 172 lb 6.4 oz (78.2 kg)  06/27/18 167 lb (75.8 kg)     Objective:    Vital Signs:  BP 130/75   Pulse 98   Ht 5' (1.524 m)   Wt 168 lb (76.2 kg)   LMP 01/10/2012   BMI 32.81 kg/m    Patient spoke in full sentences, not short of breath. No audible wheezing or coughing. Speech pattern normal.  ASSESSMENT & PLAN:    1.  Cardiomyopathy, symptomatically stable at this time on medical therapy including metoprolol and Cardizem CD.  We will obtain a follow-up echocardiogram.  There is room for further up titration of her beta-blocker if needed.  Continue with regular exercise.  2.  Mixed hyperlipidemia, she continues on Lipitor with follow-up by PCP.  3.  Essential hypertension by  history, systolic is in the 998P today.  COVID-19 Education: The signs and symptoms of COVID-19 were discussed with the patient and how to seek care for testing (follow up with PCP or arrange E-visit).  The importance of social distancing was discussed today.  Time:   Today, I have spent 6 minutes with the patient with telehealth technology discussing the above problems.     Medication Adjustments/Labs and Tests Ordered: Current medicines are reviewed at length with the patient today.  Concerns regarding medicines are outlined above.   Tests Ordered: Orders Placed This Encounter  Procedures  . ECHOCARDIOGRAM COMPLETE    Medication Changes: No orders of the defined types were placed in this encounter.   Follow Up:  In Person 6 months in the [place] office.  Signed, Rozann Lesches, MD  06/09/2019 4:35 PM    [place]

## 2019-06-24 ENCOUNTER — Other Ambulatory Visit: Payer: Self-pay

## 2019-06-24 ENCOUNTER — Ambulatory Visit (HOSPITAL_COMMUNITY)
Admission: RE | Admit: 2019-06-24 | Discharge: 2019-06-24 | Disposition: A | Discharge status: Home / Self Care | Payer: 59 | Source: Ambulatory Visit | Attending: Cardiology | Admitting: Cardiology

## 2019-06-24 DIAGNOSIS — I422 Other hypertrophic cardiomyopathy: Secondary | ICD-10-CM | POA: Diagnosis not present

## 2019-06-24 NOTE — Progress Notes (Signed)
*  PRELIMINARY RESULTS* Echocardiogram 2D Echocardiogram has been performed.  Beth Li 06/24/2019, 9:29 AM

## 2019-09-04 ENCOUNTER — Other Ambulatory Visit: Payer: Self-pay

## 2019-09-04 DIAGNOSIS — Z20822 Contact with and (suspected) exposure to covid-19: Secondary | ICD-10-CM

## 2019-09-05 LAB — NOVEL CORONAVIRUS, NAA: SARS-CoV-2, NAA: NOT DETECTED

## 2019-10-08 ENCOUNTER — Ambulatory Visit: Payer: 59 | Attending: Internal Medicine

## 2019-10-08 ENCOUNTER — Other Ambulatory Visit: Payer: Self-pay

## 2019-10-08 DIAGNOSIS — Z20822 Contact with and (suspected) exposure to covid-19: Secondary | ICD-10-CM

## 2019-10-09 LAB — NOVEL CORONAVIRUS, NAA: SARS-CoV-2, NAA: NOT DETECTED

## 2019-10-12 ENCOUNTER — Telehealth: Payer: Self-pay

## 2019-10-12 NOTE — Telephone Encounter (Signed)
Pt. Given COVID 19 results, verbalizes understanding. 

## 2020-02-02 ENCOUNTER — Other Ambulatory Visit: Payer: Self-pay | Admitting: Internal Medicine

## 2020-02-02 DIAGNOSIS — Z1231 Encounter for screening mammogram for malignant neoplasm of breast: Secondary | ICD-10-CM

## 2020-02-17 ENCOUNTER — Ambulatory Visit
Admission: RE | Admit: 2020-02-17 | Discharge: 2020-02-17 | Disposition: A | Discharge status: Home / Self Care | Payer: 59 | Source: Ambulatory Visit | Attending: Internal Medicine | Admitting: Internal Medicine

## 2020-02-17 ENCOUNTER — Other Ambulatory Visit: Payer: Self-pay

## 2020-02-17 DIAGNOSIS — Z1231 Encounter for screening mammogram for malignant neoplasm of breast: Secondary | ICD-10-CM

## 2020-02-19 ENCOUNTER — Other Ambulatory Visit: Payer: Self-pay | Admitting: Internal Medicine

## 2020-02-19 DIAGNOSIS — R928 Other abnormal and inconclusive findings on diagnostic imaging of breast: Secondary | ICD-10-CM

## 2020-02-22 ENCOUNTER — Other Ambulatory Visit: Payer: Self-pay | Admitting: Internal Medicine

## 2020-02-22 ENCOUNTER — Ambulatory Visit
Admission: RE | Admit: 2020-02-22 | Discharge: 2020-02-22 | Disposition: A | Discharge status: Home / Self Care | Payer: 59 | Source: Ambulatory Visit | Attending: Internal Medicine | Admitting: Internal Medicine

## 2020-02-22 ENCOUNTER — Other Ambulatory Visit: Payer: Self-pay

## 2020-02-22 ENCOUNTER — Ambulatory Visit: Payer: 59

## 2020-02-22 DIAGNOSIS — R928 Other abnormal and inconclusive findings on diagnostic imaging of breast: Secondary | ICD-10-CM

## 2020-02-22 DIAGNOSIS — N6310 Unspecified lump in the right breast, unspecified quadrant: Secondary | ICD-10-CM

## 2020-02-24 ENCOUNTER — Other Ambulatory Visit: Payer: 59

## 2020-04-12 ENCOUNTER — Telehealth: Payer: Self-pay | Admitting: Cardiology

## 2020-04-12 NOTE — Telephone Encounter (Signed)
°  Patient Consent for Virtual Visit         Beth Li has provided verbal consent on 04/12/2020 for a virtual visit (video or telephone).   CONSENT FOR VIRTUAL VISIT FOR:  Beth Li  By participating in this virtual visit I agree to the following:  I hereby voluntarily request, consent and authorize [facility] and its employed or contracted physicians, Producer, television/film/video, nurse practitioners or other licensed health care professionals (the Practitioner), to provide me with telemedicine health care services (the Services") as deemed necessary by the treating Practitioner. I acknowledge and consent to receive the Services by the Practitioner via telemedicine. I understand that the telemedicine visit will involve communicating with the Practitioner through live audiovisual communication technology and the disclosure of certain medical information by electronic transmission. I acknowledge that I have been given the opportunity to request an in-person assessment or other available alternative prior to the telemedicine visit and am voluntarily participating in the telemedicine visit.  I understand that I have the right to withhold or withdraw my consent to the use of telemedicine in the course of my care at any time, without affecting my right to future care or treatment, and that the Practitioner or I may terminate the telemedicine visit at any time. I understand that I have the right to inspect all information obtained and/or recorded in the course of the telemedicine visit and may receive copies of available information for a reasonable fee.  I understand that some of the potential risks of receiving the Services via telemedicine include:   Delay or interruption in medical evaluation due to technological equipment failure or disruption;  Information transmitted may not be sufficient (e.g. poor resolution of images) to allow for appropriate medical decision making by the  Practitioner; and/or   In rare instances, security protocols could fail, causing a breach of personal health information.  Furthermore, I acknowledge that it is my responsibility to provide information about my medical history, conditions and care that is complete and accurate to the best of my ability. I acknowledge that Practitioner's advice, recommendations, and/or decision may be based on factors not within their control, such as incomplete or inaccurate data provided by me or distortions of diagnostic images or specimens that may result from electronic transmissions. I understand that the practice of medicine is not an exact science and that Practitioner makes no warranties or guarantees regarding treatment outcomes. I acknowledge that a copy of this consent can be made available to me via my patient portal [facility] MyChart), or I can request a printed copy by calling the office of [facility].    I understand that my insurance will be billed for this visit.   I have read or had this consent read to me.  I understand the contents of this consent, which adequately explains the benefits and risks of the Services being provided via telemedicine.   I have been provided ample opportunity to ask questions regarding this consent and the Services and have had my questions answered to my satisfaction.  I give my informed consent for the services to be provided through the use of telemedicine in my medical care

## 2020-04-25 ENCOUNTER — Telehealth (INDEPENDENT_AMBULATORY_CARE_PROVIDER_SITE_OTHER): Payer: 59 | Admitting: Cardiology

## 2020-04-25 ENCOUNTER — Encounter: Payer: Self-pay | Admitting: Cardiology

## 2020-04-25 ENCOUNTER — Other Ambulatory Visit: Payer: Self-pay

## 2020-04-25 VITALS — BP 122/80 | HR 96 | Ht 60.0 in | Wt 160.0 lb

## 2020-04-25 DIAGNOSIS — I10 Essential (primary) hypertension: Secondary | ICD-10-CM | POA: Diagnosis not present

## 2020-04-25 DIAGNOSIS — E782 Mixed hyperlipidemia: Secondary | ICD-10-CM | POA: Diagnosis not present

## 2020-04-25 DIAGNOSIS — I421 Obstructive hypertrophic cardiomyopathy: Secondary | ICD-10-CM | POA: Diagnosis not present

## 2020-04-25 NOTE — Progress Notes (Signed)
Virtual Visit via Telephone Note   This visit type was conducted due to national recommendations for restrictions regarding the COVID-19 Pandemic (e.g. social distancing) in an effort to limit this patient's exposure and mitigate transmission in our community.  Due to her co-morbid illnesses, this patient is at least at moderate risk for complications without adequate follow up.  This format is felt to be most appropriate for this patient at this time.  The patient did not have access to video technology/had technical difficulties with video requiring transitioning to audio format only (telephone).  All issues noted in this document were discussed and addressed.  No physical exam could be performed with this format.  Please refer to the patient's chart for her  consent to telehealth for [facility].    Date:  04/25/2020   ID:  Beth Li, DOB [birth year], MRN [id number] The patient was identified using 2 identifiers.  Patient Location: Home Provider Location: Office/Clinic  PCP:  Avis Epley, PA-C  Cardiologist:  Nona Dell, MD Electrophysiologist:  None   Evaluation Performed:  Follow-Up Visit  Chief Complaint:   Cardiac follow-up  History of Present Illness:    Beth Li is a 59 y.o. female last assessed via telehealth encounter in September 2020.  We spoke by phone today.  She states that she is walking 5 days a week, 30 minutes at a time.  Reports NYHA class II dyspnea, no exertional chest pain, palpitations, or syncope.  He states that she had COVID-19 back in December 2020, convalesced at home, did not require supportive measures or hospital stay.  I reviewed her medications which are outlined below.  She remains on a combination of Cardizem CD and Lopressor. Follow-up echocardiogram from September 2020 is outlined below.   Past Medical History:  Diagnosis Date  . Essential hypertension   . GERD (gastroesophageal reflux disease)   .  Hyperlipidemia   . Hypertrophic cardiomyopathy (HCC)   . Type 2 diabetes mellitus (HCC)    Past Surgical History:  Procedure Laterality Date  . NO PAST SURGERIES       Current Meds  Medication Sig  . amphetamine-dextroamphetamine (ADDERALL) 20 MG tablet Take 20 mg by mouth daily as needed.   Marland Kitchen aspirin EC 81 MG tablet Take 81 mg by mouth daily.  Marland Kitchen atorvastatin (LIPITOR) 40 MG tablet 1 TABLET DAILY ORAL  . diltiazem (CARDIZEM CD) 180 MG 24 hr capsule Take 180 mg by mouth 2 (two) times daily.  Marland Kitchen FARXIGA 10 MG TABS tablet Take 10 mg by mouth daily.   Marland Kitchen ibuprofen (ADVIL,MOTRIN) 200 MG tablet Take 600 mg by mouth every 6 (six) hours as needed for mild pain or moderate pain.  . metoprolol tartrate (LOPRESSOR) 50 MG tablet Take 1 tablet (50 mg total) by mouth 2 (two) times daily.  . traZODone (DESYREL) 100 MG tablet TAKE 1 TABLET BY MOUTH EVERYDAY AT BEDTIME  . zolpidem (AMBIEN) 10 MG tablet Take 10 mg by mouth at bedtime as needed.     Allergies:   Patient has no known allergies.   ROS:   No syncope.  Prior CV studies:   The following studies were reviewed today:  Echocardiogram 06/24/2019: 1. Left ventricular ejection fraction, by visual estimation, is 70 to  75%. The left ventricle has hyperdynamic function. There is severely  increased left ventricular hypertrophy with asymmetric septal hypertrophy.  Mild LVOT gradient. Consistent with  hypertrophic cardiomyopathy.  2. Left ventricular diastolic Doppler parameters are consistent with  impaired relaxation pattern of LV diastolic filling.  3. Global right ventricle has normal systolic function.The right  ventricular size is normal. No increase in right ventricular wall  thickness.  4. Left atrial size was upper normal.  5. Right atrial size was normal.  6. Moderate mitral annular calcification.  7. The mitral valve is grossly normal. Trace mitral valve regurgitation.  8. The tricuspid valve is grossly normal. Tricuspid  valve regurgitation  is trivial.  9. The aortic valve is tricuspid Aortic valve regurgitation is mild by  color flow Doppler. Mild aortic valve sclerosis without stenosis.  10. The pulmonic valve was grossly normal. Pulmonic valve regurgitation is  trivial by color flow Doppler.  11. Normal pulmonary artery systolic pressure.  12. The inferior vena cava is normal in size with greater than 50%  respiratory variability, suggesting right atrial pressure of 3 mmHg.  13. The tricuspid regurgitant velocity is 2.29 m/s, and with an assumed  right atrial pressure of 3 mmHg, the estimated right ventricular systolic  pressure is normal at 24.0 mmHg.   In comparison to the previous echocardiogram(s): Prior examinations were  reviewed in a side by side comparison of images. No significant change in  LVEF compared with prior study 08/2017. There is somewhat less prominent  SAM and LVOT gradient on the  current study.   Labs/Other Tests and Data Reviewed:    EKG:  An ECG dated 06/27/2018 was personally reviewed today and demonstrated:  Sinus rhythm with nonspecific ST-T changes.  Recent Labs:  No interval lab work for review today.  Wt Readings from Last 3 Encounters:  04/25/20 160 lb (72.6 kg)  06/09/19 168 lb (76.2 kg)  08/08/18 172 lb 6.4 oz (78.2 kg)     Objective:    Vital Signs:  BP 122/80   Pulse 96   Ht 5' (1.524 m)   Wt 160 lb (72.6 kg)   LMP 01/10/2012   BMI 31.25 kg/m    Patient spoke in full sentences, not short of breath on the phone.  ASSESSMENT & PLAN:    1.  Hypertrophic obstructive cardiomyopathy.  She is doing well symptomatically on Cardizem CD and Lopressor.  Echocardiogram from September 2020 as outlined above.  Continue regular walking plan and observation.  2.  Mixed hyperlipidemia.  She continues on Lipitor with follow-up by PCP.  3.  Essential hypertension, blood pressure is well controlled today.  No changes were made in current regimen.  Time:     Today, I have spent 6 minutes with the patient with telehealth technology discussing the above problems.     Medication Adjustments/Labs and Tests Ordered: Current medicines are reviewed at length with the patient today.  Concerns regarding medicines are outlined above.   Tests Ordered: No orders of the defined types were placed in this encounter.   Medication Changes: No orders of the defined types were placed in this encounter.   Follow Up:  In Person 6 months in the [place] office.  Signed, Nona Dell, MD  04/25/2020 2:33 PM    [facility]

## 2020-04-25 NOTE — Patient Instructions (Signed)
Medication Instructions:  °Your physician recommends that you continue on your current medications as directed. Please refer to the Current Medication list given to you today. ° °*If you need a refill on your cardiac medications before your next appointment, please call your pharmacy* ° ° °Lab Work: °None today °If you have labs (blood work) drawn today and your tests are completely normal, you will receive your results only by: °• MyChart Message (if you have MyChart) OR °• A paper copy in the mail °If you have any lab test that is abnormal or we need to change your treatment, we will call you to review the results. ° ° °Testing/Procedures: °None today ° ° °Follow-Up: °At [facility], you and your health needs are our priority.  As part of our continuing mission to provide you with exceptional heart care, we have created designated Provider Care Teams.  These Care Teams include your primary Cardiologist (physician) and Advanced Practice Providers (APPs -  Physician Assistants and Nurse Practitioners) who all work together to provide you with the care you need, when you need it. ° °We recommend signing up for the patient portal called "MyChart".  Sign up information is provided on this After Visit Summary.  MyChart is used to connect with patients for Virtual Visits (Telemedicine).  Patients are able to view lab/test results, encounter notes, upcoming appointments, etc.  Non-urgent messages can be sent to your provider as well.   °To learn more about what you can do with MyChart, go to [email or web address].   ° °Your next appointment:   °6 month(s) ° °The format for your next appointment:   °In Person ° °Provider:   °Samuel McDowell, MD ° ° °Other Instructions °None ° ° ° ° °Thank you for choosing [facility] ! ° ° ° ° ° ° ° ° °

## 2020-08-30 ENCOUNTER — Other Ambulatory Visit: Payer: 59

## 2020-09-14 ENCOUNTER — Other Ambulatory Visit: Payer: 59

## 2020-10-26 ENCOUNTER — Ambulatory Visit: Payer: 59 | Admitting: Cardiology

## 2021-10-04 ENCOUNTER — Ambulatory Visit (INDEPENDENT_AMBULATORY_CARE_PROVIDER_SITE_OTHER): Payer: 59 | Admitting: Cardiology

## 2021-10-04 ENCOUNTER — Encounter: Payer: Self-pay | Admitting: Cardiology

## 2021-10-04 VITALS — BP 142/82 | HR 88 | Ht 60.0 in | Wt 165.2 lb

## 2021-10-04 DIAGNOSIS — I421 Obstructive hypertrophic cardiomyopathy: Secondary | ICD-10-CM

## 2021-10-04 DIAGNOSIS — E782 Mixed hyperlipidemia: Secondary | ICD-10-CM

## 2021-10-04 MED ORDER — DILTIAZEM HCL ER COATED BEADS 240 MG PO CP24: 240.0000 mg | ORAL_CAPSULE | Freq: Every morning | ORAL | 2 refills | Status: DC

## 2021-10-04 NOTE — Patient Instructions (Addendum)
Medication Instructions:  Your physician has recommended you make the following change in your medication:  Increase cardizem cd to 240 mg daily in the morning Continue other medications the same  Labwork: none  Testing/Procedures: Your physician has requested that you have an echocardiogram. Echocardiography is a painless test that uses sound waves to create images of your heart. It provides your doctor with information about the size and shape of your heart and how well your hearts chambers and valves are working. This procedure takes approximately one hour. There are no restrictions for this procedure.  Follow-Up: Your physician recommends that you schedule a follow-up appointment in: 6 months  Any Other Special Instructions Will Be Listed Below (If Applicable).  If you need a refill on your cardiac medications before your next appointment, please call your pharmacy.

## 2021-10-04 NOTE — Progress Notes (Signed)
Cardiology Office Note  Date: 10/04/2021   ID: Beth Li, DOB December 10, 1960, MRN [id number]  PCP:  Beth Samples, PA-C  Cardiologist:  Beth Lesches, MD Electrophysiologist:  None   Chief Complaint  Patient presents with   Cardiac follow-up    History of Present Illness: [name] is a 61 y.o. female last assessed via telehealth encounter in July 2021.  She is here for a follow-up visit.  She states that over the last for 5 months she has noticed intermittently shortness of breath with activity such as walking up steps.  No angina symptoms and no syncope.  We went over her medications.  She has been taking her Cardizem CD once a day and also metoprolol once a day on most occasions.  Sometimes takes an evening dose of both.  We discussed adjustments in medical therapy and also a follow-up echocardiogram.  I personally reviewed her ECG today which shows sinus rhythm.  Past Medical History:  Diagnosis Date   Essential hypertension    GERD (gastroesophageal reflux disease)    Hyperlipidemia    Hypertrophic cardiomyopathy (HCC)    Type 2 diabetes mellitus (HCC)     Past Surgical History:  Procedure Laterality Date   NO PAST SURGERIES      Current Outpatient Medications  Medication Sig Dispense Refill   amphetamine-dextroamphetamine (ADDERALL) 20 MG tablet Take 20 mg by mouth daily as needed.   0   aspirin EC 81 MG tablet Take 81 mg by mouth daily.     atorvastatin (LIPITOR) 40 MG tablet 1 TABLET DAILY ORAL  3   diltiazem (CARDIZEM CD) 240 MG 24 hr capsule Take 1 capsule (240 mg total) by mouth in the morning. 90 capsule 2   FARXIGA 10 MG TABS tablet Take 10 mg by mouth daily.      ibuprofen (ADVIL,MOTRIN) 200 MG tablet Take 600 mg by mouth every 6 (six) hours as needed for mild pain or moderate pain.     metoprolol tartrate (LOPRESSOR) 50 MG tablet Take 1 tablet (50 mg total) by mouth 2 (two) times daily. 180 tablet 3   traZODone (DESYREL) 100 MG tablet  TAKE 1 TABLET BY MOUTH EVERYDAY AT BEDTIME  3   zolpidem (AMBIEN) 10 MG tablet Take 10 mg by mouth at bedtime as needed.  5   No current facility-administered medications for this visit.   Allergies:  Patient has no known allergies.   ROS: No orthopnea or PND.  No leg swelling.  Physical Exam: VS:  BP (!) 142/82    Pulse 88    Ht 5' (1.524 m)    Wt 165 lb 3.2 oz (74.9 kg)    LMP 01/10/2012    SpO2 98%    BMI 32.26 kg/m , BMI Body mass index is 32.26 kg/m.  Wt Readings from Last 3 Encounters:  10/04/21 165 lb 3.2 oz (74.9 kg)  04/25/20 160 lb (72.6 kg)  06/09/19 168 lb (76.2 kg)    General: Patient appears comfortable at rest. HEENT: Conjunctiva and lids normal, wearing a mask. Neck: Supple, no elevated JVP or carotid bruits, no thyromegaly. Lungs: Clear to auscultation, nonlabored breathing at rest. Cardiac: Regular rate and rhythm, no S3, 3/6 systolic murmur, no pericardial rub. Extremities: No pitting edema.  ECG:  An ECG dated 06/27/2018 was personally reviewed today and demonstrated:  Sinus rhythm with nonspecific ST-T changes.  Recent Labwork:  No interval lab work for review today.  Other Studies Reviewed Today:  Echocardiogram 06/24/2019:  1. Left ventricular ejection fraction, by visual estimation, is 70 to  75%. The left ventricle has hyperdynamic function. There is severely  increased left ventricular hypertrophy with asymmetric septal hypertrophy.  Mild LVOT gradient. Consistent with  hypertrophic cardiomyopathy.   2. Left ventricular diastolic Doppler parameters are consistent with  impaired relaxation pattern of LV diastolic filling.   3. Global right ventricle has normal systolic function.The right  ventricular size is normal. No increase in right ventricular wall  thickness.   4. Left atrial size was upper normal.   5. Right atrial size was normal.   6. Moderate mitral annular calcification.   7. The mitral valve is grossly normal. Trace mitral valve  regurgitation.   8. The tricuspid valve is grossly normal. Tricuspid valve regurgitation  is trivial.   9. The aortic valve is tricuspid Aortic valve regurgitation is mild by  color flow Doppler. Mild aortic valve sclerosis without stenosis.  10. The pulmonic valve was grossly normal. Pulmonic valve regurgitation is  trivial by color flow Doppler.  11. Normal pulmonary artery systolic pressure.  12. The inferior vena cava is normal in size with greater than 50%  respiratory variability, suggesting right atrial pressure of 3 mmHg.  13. The tricuspid regurgitant velocity is 2.29 m/s, and with an assumed  right atrial pressure of 3 mmHg, the estimated right ventricular systolic  pressure is normal at 24.0 mmHg.   Assessment and Plan:  1.  Hypertrophic obstructive cardiomyopathy.  Last echocardiogram was in September 2020, plan will be to obtain a follow-up study.  She had a mild LVOT gradient at that time.  Does report intermittent dyspnea on exertion.  Plan will be to increase Cardizem CD to 240 mg in the morning and continue metoprolol 50 mg twice daily.  Can uptitrate further from there depending on how she does.  She has had no syncope.  ECG reviewed.  2.  Mixed hyperlipidemia, she continues on Lipitor with follow-up at [facility].  Medication Adjustments/Labs and Tests Ordered: Current medicines are reviewed at length with the patient today.  Concerns regarding medicines are outlined above.   Tests Ordered: Orders Placed This Encounter  Procedures   EKG 12-Lead   ECHOCARDIOGRAM COMPLETE    Medication Changes: Meds ordered this encounter  Medications   diltiazem (CARDIZEM CD) 240 MG 24 hr capsule    Sig: Take 1 capsule (240 mg total) by mouth in the morning.    Dispense:  90 capsule    Refill:  2    10/04/2021 dose increase    Disposition:  Follow up  6 months.  Signed, Satira Sark, MD, [facility] 10/04/2021 4:02 PM    [facility] [address], [place], [place] [address] Phone: [phone]; Fax: [phone]

## 2021-10-12 ENCOUNTER — Telehealth: Payer: Self-pay | Admitting: Cardiology

## 2021-10-12 NOTE — Telephone Encounter (Signed)
Patient was calling to see how much her procedure will be, because she didn't meet her deductible. Please advise

## 2021-10-12 NOTE — Telephone Encounter (Signed)
New Message:   Please call patient,  says she have some questions about her procedure she is having on 10-23-21.

## 2021-10-12 NOTE — Telephone Encounter (Signed)
Returned call to patient. She is going to contact our billing department in regards to what her part will be after the echo.

## 2021-10-20 ENCOUNTER — Encounter (HOSPITAL_COMMUNITY): Payer: Self-pay | Admitting: Emergency Medicine

## 2021-10-20 ENCOUNTER — Emergency Department (HOSPITAL_COMMUNITY): Payer: 59

## 2021-10-20 ENCOUNTER — Observation Stay (HOSPITAL_COMMUNITY)
Admission: EM | Admit: 2021-10-20 | Discharge: 2021-10-22 | Disposition: A | Discharge status: Home / Self Care | Payer: 59 | Attending: Internal Medicine | Admitting: Internal Medicine

## 2021-10-20 ENCOUNTER — Other Ambulatory Visit: Payer: Self-pay

## 2021-10-20 DIAGNOSIS — E669 Obesity, unspecified: Secondary | ICD-10-CM | POA: Diagnosis not present

## 2021-10-20 DIAGNOSIS — K5730 Diverticulosis of large intestine without perforation or abscess without bleeding: Principal | ICD-10-CM | POA: Insufficient documentation

## 2021-10-20 DIAGNOSIS — K644 Residual hemorrhoidal skin tags: Secondary | ICD-10-CM | POA: Insufficient documentation

## 2021-10-20 DIAGNOSIS — E119 Type 2 diabetes mellitus without complications: Secondary | ICD-10-CM | POA: Diagnosis not present

## 2021-10-20 DIAGNOSIS — Z20822 Contact with and (suspected) exposure to covid-19: Secondary | ICD-10-CM | POA: Insufficient documentation

## 2021-10-20 DIAGNOSIS — Z7982 Long term (current) use of aspirin: Secondary | ICD-10-CM | POA: Diagnosis not present

## 2021-10-20 DIAGNOSIS — D509 Iron deficiency anemia, unspecified: Secondary | ICD-10-CM | POA: Insufficient documentation

## 2021-10-20 DIAGNOSIS — K922 Gastrointestinal hemorrhage, unspecified: Secondary | ICD-10-CM | POA: Diagnosis not present

## 2021-10-20 DIAGNOSIS — K648 Other hemorrhoids: Secondary | ICD-10-CM | POA: Diagnosis not present

## 2021-10-20 DIAGNOSIS — R0602 Shortness of breath: Secondary | ICD-10-CM | POA: Diagnosis present

## 2021-10-20 DIAGNOSIS — E1169 Type 2 diabetes mellitus with other specified complication: Secondary | ICD-10-CM | POA: Diagnosis not present

## 2021-10-20 DIAGNOSIS — G4733 Obstructive sleep apnea (adult) (pediatric): Secondary | ICD-10-CM | POA: Diagnosis present

## 2021-10-20 DIAGNOSIS — Z79899 Other long term (current) drug therapy: Secondary | ICD-10-CM | POA: Diagnosis not present

## 2021-10-20 DIAGNOSIS — I10 Essential (primary) hypertension: Secondary | ICD-10-CM | POA: Insufficient documentation

## 2021-10-20 DIAGNOSIS — R0600 Dyspnea, unspecified: Secondary | ICD-10-CM

## 2021-10-20 LAB — CBC WITH DIFFERENTIAL/PLATELET
Abs Immature Granulocytes: 0.04 10*3/uL (ref 0.00–0.07)
Basophils Absolute: 0.1 10*3/uL (ref 0.0–0.1)
Basophils Relative: 1 %
Eosinophils Absolute: 0.1 10*3/uL (ref 0.0–0.5)
Eosinophils Relative: 1 %
HCT: 27.6 % — ABNORMAL LOW (ref 36.0–46.0)
Hemoglobin: 7.9 g/dL — ABNORMAL LOW (ref 12.0–15.0)
Immature Granulocytes: 0 %
Lymphocytes Relative: 26 %
Lymphs Abs: 3.9 10*3/uL (ref 0.7–4.0)
MCH: 22.4 pg — ABNORMAL LOW (ref 26.0–34.0)
MCHC: 28.6 g/dL — ABNORMAL LOW (ref 30.0–36.0)
MCV: 78.2 fL — ABNORMAL LOW (ref 80.0–100.0)
Monocytes Absolute: 1.1 10*3/uL — ABNORMAL HIGH (ref 0.1–1.0)
Monocytes Relative: 7 %
Neutro Abs: 9.5 10*3/uL — ABNORMAL HIGH (ref 1.7–7.7)
Neutrophils Relative %: 65 %
Platelets: 331 10*3/uL (ref 150–400)
RBC: 3.53 MIL/uL — ABNORMAL LOW (ref 3.87–5.11)
RDW: 17.1 % — ABNORMAL HIGH (ref 11.5–15.5)
WBC: 14.8 10*3/uL — ABNORMAL HIGH (ref 4.0–10.5)
nRBC: 0 % (ref 0.0–0.2)

## 2021-10-20 LAB — COMPREHENSIVE METABOLIC PANEL
ALT: 12 U/L (ref 0–44)
AST: 17 U/L (ref 15–41)
Albumin: 3.8 g/dL (ref 3.5–5.0)
Alkaline Phosphatase: 103 U/L (ref 38–126)
Anion gap: 8 (ref 5–15)
BUN: 18 mg/dL (ref 6–20)
CO2: 24 mmol/L (ref 22–32)
Calcium: 8.7 mg/dL — ABNORMAL LOW (ref 8.9–10.3)
Chloride: 103 mmol/L (ref 98–111)
Creatinine, Ser: 0.95 mg/dL (ref 0.44–1.00)
GFR, Estimated: >60 mL/min (ref >60)
Glucose, Bld: 127 mg/dL — ABNORMAL HIGH (ref 70–99)
Potassium: 3.4 mmol/L — ABNORMAL LOW (ref 3.5–5.1)
Sodium: 135 mmol/L (ref 135–145)
Total Bilirubin: 0.5 mg/dL (ref 0.3–1.2)
Total Protein: 7.3 g/dL (ref 6.5–8.1)

## 2021-10-20 LAB — RESP PANEL BY RT-PCR (FLU A&B, COVID) ARPGX2
Influenza A by PCR: NEGATIVE
Influenza B by PCR: NEGATIVE
SARS Coronavirus 2 by RT PCR: NEGATIVE

## 2021-10-20 LAB — FERRITIN: Ferritin: 6 ng/mL — ABNORMAL LOW (ref 11–307)

## 2021-10-20 LAB — POC OCCULT BLOOD, ED: Fecal Occult Bld: POSITIVE — AB

## 2021-10-20 LAB — RETICULOCYTES
Immature Retic Fract: 40.7 % — ABNORMAL HIGH (ref 2.3–15.9)
RBC.: 3.58 MIL/uL — ABNORMAL LOW (ref 3.87–5.11)
Retic Count, Absolute: 73.4 10*3/uL (ref 19.0–186.0)
Retic Ct Pct: 2.1 % (ref 0.4–3.1)

## 2021-10-20 LAB — FOLATE: Folate: 7.8 ng/mL (ref >5.9)

## 2021-10-20 LAB — CBC
HCT: 28.2 % — ABNORMAL LOW (ref 36.0–46.0)
Hemoglobin: 8 g/dL — ABNORMAL LOW (ref 12.0–15.0)
MCH: 22.2 pg — ABNORMAL LOW (ref 26.0–34.0)
MCHC: 28.4 g/dL — ABNORMAL LOW (ref 30.0–36.0)
MCV: 78.1 fL — ABNORMAL LOW (ref 80.0–100.0)
Platelets: 361 10*3/uL (ref 150–400)
RBC: 3.61 MIL/uL — ABNORMAL LOW (ref 3.87–5.11)
RDW: 17.1 % — ABNORMAL HIGH (ref 11.5–15.5)
WBC: 14.7 10*3/uL — ABNORMAL HIGH (ref 4.0–10.5)
nRBC: 0 % (ref 0.0–0.2)

## 2021-10-20 LAB — IRON AND TIBC
Iron: 17 ug/dL — ABNORMAL LOW (ref 28–170)
Saturation Ratios: 3 % — ABNORMAL LOW (ref 10.4–31.8)
TIBC: 535 ug/dL — ABNORMAL HIGH (ref 250–450)
UIBC: 518 ug/dL

## 2021-10-20 LAB — CBG MONITORING, ED: Glucose-Capillary: 110 mg/dL — ABNORMAL HIGH (ref 70–99)

## 2021-10-20 LAB — ABO/RH: ABO/RH(D): O POS

## 2021-10-20 LAB — D-DIMER, QUANTITATIVE: D-Dimer, Quant: 1.51 ug/mL-FEU — ABNORMAL HIGH (ref 0.00–0.50)

## 2021-10-20 LAB — BRAIN NATRIURETIC PEPTIDE: B Natriuretic Peptide: 35.6 pg/mL (ref 0.0–100.0)

## 2021-10-20 LAB — PREPARE RBC (CROSSMATCH)

## 2021-10-20 LAB — VITAMIN B12: Vitamin B-12: 530 pg/mL (ref 180–914)

## 2021-10-20 MED ORDER — ACETAMINOPHEN 650 MG RE SUPP: 650.0000 mg | Freq: Four times a day (QID) | RECTAL | Status: DC | PRN

## 2021-10-20 MED ORDER — ACETAMINOPHEN 325 MG PO TABS: 650.0000 mg | ORAL_TABLET | Freq: Four times a day (QID) | ORAL | Status: DC | PRN

## 2021-10-20 MED ORDER — PANTOPRAZOLE SODIUM 40 MG IV SOLR
40.0000 mg | Freq: Two times a day (BID) | INTRAVENOUS | Status: DC
  Administered 2021-10-20 – 2021-10-21 (×3): 40 mg via INTRAVENOUS
  Filled 2021-10-20 (×3): qty 40

## 2021-10-20 MED ORDER — INSULIN ASPART 100 UNIT/ML IJ SOLN
0.0000 [IU] | INTRAMUSCULAR | Status: DC
  Administered 2021-10-21: 1 [IU] via SUBCUTANEOUS
  Filled 2021-10-20: qty 0.09

## 2021-10-20 MED ORDER — DILTIAZEM HCL ER COATED BEADS 180 MG PO CP24
180.0000 mg | ORAL_CAPSULE | Freq: Two times a day (BID) | ORAL | Status: DC
  Administered 2021-10-20 – 2021-10-22 (×4): 180 mg via ORAL
  Filled 2021-10-20 (×5): qty 1

## 2021-10-20 MED ORDER — METOPROLOL TARTRATE 50 MG PO TABS
50.0000 mg | ORAL_TABLET | Freq: Two times a day (BID) | ORAL | Status: DC
  Administered 2021-10-20 – 2021-10-22 (×4): 50 mg via ORAL
  Filled 2021-10-20 (×4): qty 1

## 2021-10-20 MED ORDER — SODIUM CHLORIDE 0.9 % IV SOLN
10.0000 mL/h | Freq: Once | INTRAVENOUS | Status: AC
  Administered 2021-10-20: 10 mL/h via INTRAVENOUS

## 2021-10-20 NOTE — ED Triage Notes (Signed)
Patient has had SOB x3 weeks but it got much worse today, gets SOB w/ ambulation and activities. States she has been eating a lot of ice and has no energy. No blood in stool. Non-productive cough. Denies swelling.

## 2021-10-20 NOTE — ED Provider Notes (Signed)
[facility] DEPT Provider Note   CSN: [id number] Arrival date & time: 10/20/21  1702     History  Chief Complaint  Patient presents with   Shortness of Breath    [place] is a 61 y.o. female.  61 year old female presents with 2 to 3 weeks of increasing dyspnea exertion.  Patient states that when she stands up to walk around she becomes winded and gets better with rest.  Denies any orthopnea.  No lower extremity edema.  No history of black or bloody stools.  Hematemesis noted.  No abdominal discomfort.  No treatment use prior to arrival      Home Medications Prior to Admission medications   Medication Sig Start Date End Date Taking? Authorizing Provider  amphetamine-dextroamphetamine (ADDERALL) 20 MG tablet Take 20 mg by mouth daily as needed.  12/13/16   [provider]  aspirin EC 81 MG tablet Take 81 mg by mouth daily.    [provider]  atorvastatin (LIPITOR) 40 MG tablet 1 TABLET DAILY ORAL 11/30/16   [provider]  diltiazem (CARDIZEM CD) 240 MG 24 hr capsule Take 1 capsule (240 mg total) by mouth in the morning. 10/04/21   Satira Sark, MD  FARXIGA 10 MG TABS tablet Take 10 mg by mouth daily.  06/23/18   [provider]  ibuprofen (ADVIL,MOTRIN) 200 MG tablet Take 600 mg by mouth every 6 (six) hours as needed for mild pain or moderate pain.    [provider]  metoprolol tartrate (LOPRESSOR) 50 MG tablet Take 1 tablet (50 mg total) by mouth 2 (two) times daily. 06/27/18 10/04/21  Satira Sark, MD  traZODone (DESYREL) 100 MG tablet TAKE 1 TABLET BY MOUTH EVERYDAY AT BEDTIME 07/09/18   [provider]  zolpidem (AMBIEN) 10 MG tablet Take 10 mg by mouth at bedtime as needed. 01/04/17   [provider]      Allergies    Patient has no known allergies.    Review of Systems   Review of Systems  All other systems reviewed and are negative.  Physical Exam Updated Vital  Signs BP 132/76    Pulse 85    Temp 98.1 F (36.7 C) (Oral)    Resp 17    Ht 1.524 m (5')    Wt 71.7 kg    LMP 01/10/2012    SpO2 100%    BMI 30.86 kg/m  Physical Exam Vitals and nursing note reviewed.  Constitutional:      General: She is not in acute distress.    Appearance: Normal appearance. She is well-developed. She is not toxic-appearing.  HENT:     Head: Normocephalic and atraumatic.  Eyes:     General: Lids are normal.     Conjunctiva/sclera: Conjunctivae normal.     Pupils: Pupils are equal, round, and reactive to light.  Neck:     Thyroid: No thyroid mass.     Trachea: No tracheal deviation.  Cardiovascular:     Rate and Rhythm: Normal rate and regular rhythm.     Heart sounds: Normal heart sounds. No murmur heard.   No gallop.  Pulmonary:     Effort: Pulmonary effort is normal. No respiratory distress.     Breath sounds: Normal breath sounds. No stridor. No decreased breath sounds, wheezing, rhonchi or rales.  Abdominal:     General: There is no distension.     Palpations: Abdomen is soft.     Tenderness: There is  no abdominal tenderness. There is no rebound.  Genitourinary:    Rectum: Guaiac result positive.  Musculoskeletal:        General: No tenderness. Normal range of motion.     Cervical back: Normal range of motion and neck supple.  Skin:    General: Skin is warm and dry.     Findings: No abrasion or rash.  Neurological:     Mental Status: She is alert and oriented to person, place, and time. Mental status is at baseline.     GCS: GCS eye subscore is 4. GCS verbal subscore is 5. GCS motor subscore is 6.     Cranial Nerves: No cranial nerve deficit.     Sensory: No sensory deficit.     Motor: Motor function is intact.  Psychiatric:        Attention and Perception: Attention normal.        Speech: Speech normal.        Behavior: Behavior normal.    ED Results / Procedures / Treatments   Labs (all labs ordered are listed, but only abnormal results  are displayed) Labs Reviewed  COMPREHENSIVE METABOLIC PANEL - Abnormal; Notable for the following components:      Result Value   Potassium 3.4 (*)    Glucose, Bld 127 (*)    Calcium 8.7 (*)    All other components within normal limits  CBC WITH DIFFERENTIAL/PLATELET - Abnormal; Notable for the following components:   WBC 14.8 (*)    RBC 3.53 (*)    Hemoglobin 7.9 (*)    HCT 27.6 (*)    MCV 78.2 (*)    MCH 22.4 (*)    MCHC 28.6 (*)    RDW 17.1 (*)    Neutro Abs 9.5 (*)    Monocytes Absolute 1.1 (*)    All other components within normal limits  POC OCCULT BLOOD, ED  TYPE AND SCREEN  PREPARE RBC (CROSSMATCH)    EKG EKG Interpretation  Date/Time:  Friday October 20 2021 17:50:37 EST Ventricular Rate:  89 PR Interval:  158 QRS Duration: 83 QT Interval:  382 QTC Calculation: 465 R Axis:   57 Text Interpretation: Sinus rhythm Probable left atrial enlargement Confirmed by Lacretia Leigh ([id number]) on 10/20/2021 7:09:22 PM  Radiology DG Chest 2 View  Result Date: 10/20/2021 CLINICAL DATA:  Shortness of breath for 3 weeks, worsening today. EXAM: CHEST - 2 VIEW COMPARISON:  Chest CT 01/13/2012 FINDINGS: Mild enlargement of the cardiopericardial silhouette, without edema. Mild thoracic spondylosis. Mild central airway thickening. The lungs appear otherwise clear. No blunting of the costophrenic angles. IMPRESSION: 1. Airway thickening is present, suggesting bronchitis or reactive airways disease. 2. Mild enlargement of the cardiopericardial silhouette, without edema. Electronically Signed   By: Van Clines M.D.   On: 10/20/2021 18:14    Procedures Procedures    Medications Ordered in ED Medications  0.9 %  sodium chloride infusion (has no administration in time range)    ED Course/ Medical Decision Making/ A&P                           Medical Decision Making Amount and/or Complexity of Data Reviewed Labs: ordered. Radiology: ordered.  Risk Prescription drug  management.   Patient here with symptomatic anemia and guaiac positive stools.  Packed red blood cells ordered and will send secure chat message to GI on-call.  Patient will require hospitalist admission  Final Clinical Impression(s) / ED Diagnoses Final diagnoses:  None    Rx / DC Orders ED Discharge Orders     None         Lacretia Leigh, MD 10/20/21 1929

## 2021-10-20 NOTE — H&P (Addendum)
History and Physical    Ishita Mcnerney Bearden [id number] DOB: 1961/03/25 DOA: 10/20/2021  PCP: Avis Epley, PA-C  Patient coming from: Home.  Chief Complaint: Shortness of breath.  HPI: Beth Li is a 61 y.o. female with history of hypertrophic cardiomyopathy presents to the ER because of worsening shortness of breath.  Patient has been having exertional shortness of breath for the last 3 weeks.  Over the last few days has noticed increased dark stools.  Denies any nausea vomiting abdominal pain.  Denies chest pain or fever chills.  ED Course: In the ER patient's hemoglobin is noticed to be around 7.9 with only labs available in our system is in 2013 which shows hemoglobin of 15.3.  Stool for occult blood was positive.  On-call gastroenterologist was consulted started on Protonix admitted for further work-up.  Chest x-ray shows cardiomegaly and reactive airway disease.  Review of Systems: As per HPI, rest all negative.   Past Medical History:  Diagnosis Date   Essential hypertension    GERD (gastroesophageal reflux disease)    Hyperlipidemia    Hypertrophic cardiomyopathy (HCC)    Type 2 diabetes mellitus (HCC)     Past Surgical History:  Procedure Laterality Date   NO PAST SURGERIES       reports that she has never smoked. She has never used smokeless tobacco. She reports that she does not currently use alcohol. She reports that she does not use drugs.  No Known Allergies  Family History  Problem Relation Age of Onset   Heart disease Maternal Grandmother    Congestive Heart Failure Maternal Grandmother    Heart disease Mother    Heart attack Father    Congestive Heart Failure Sister    Heart attack Paternal Grandmother     Prior to Admission medications   Medication Sig Start Date End Date Taking? Authorizing Provider  amphetamine-dextroamphetamine (ADDERALL) 20 MG tablet Take 20 mg by mouth daily as needed.  12/13/16   [provider]  aspirin  EC 81 MG tablet Take 81 mg by mouth daily.    [provider]  atorvastatin (LIPITOR) 40 MG tablet Take 40 mg by mouth daily. 11/30/16   [provider]  DILT-XR 180 MG 24 hr capsule Take 180 mg by mouth 2 (two) times daily. 10/10/21   [provider]  diltiazem (CARDIZEM CD) 240 MG 24 hr capsule Take 1 capsule (240 mg total) by mouth in the morning. 10/04/21   Jonelle Sidle, MD  FARXIGA 10 MG TABS tablet Take 10 mg by mouth daily.  06/23/18   [provider]  ibuprofen (ADVIL,MOTRIN) 200 MG tablet Take 600 mg by mouth every 6 (six) hours as needed for mild pain or moderate pain.    [provider]  metoprolol tartrate (LOPRESSOR) 50 MG tablet Take 1 tablet (50 mg total) by mouth 2 (two) times daily. 06/27/18 10/04/21  Jonelle Sidle, MD  traZODone (DESYREL) 100 MG tablet Take 100 mg by mouth at bedtime. 07/09/18   [provider]  zolpidem (AMBIEN) 10 MG tablet Take 10 mg by mouth at bedtime as needed. 01/04/17   [provider]    Physical Exam: Constitutional: Moderately built and nourished. Vitals:   10/20/21 1819 10/20/21 1830 10/20/21 1900 10/20/21 1930  BP: 132/76 128/65 122/70 (!) 155/78  Pulse: 85 84 85 (!) 102  Resp: 17 18 18 18   Temp:      TempSrc:      SpO2: 100%  100% 100% 98%  Weight:      Height:       Eyes: Anicteric no pallor. ENMT: No discharge from the ears eyes nose and mouth. Neck: No mass felt.  No JVD appreciated. Respiratory: No rhonchi or crepitations. Cardiovascular: S1-S2 heard. Abdomen: Soft nontender bowel sound present. Musculoskeletal: No edema. Skin: No rash. Neurologic: Alert awake oriented time place and person.  Moves all extremities. Psychiatric: Appears normal.  Normal affect.   Labs on Admission: I have personally reviewed following labs and imaging studies  CBC: Recent Labs  Lab 10/20/21 1752  WBC 14.8*  NEUTROABS 9.5*  HGB 7.9*  HCT 27.6*  MCV 78.2*  PLT 331   Basic  Metabolic Panel: Recent Labs  Lab 10/20/21 1752  NA 135  K 3.4*  CL 103  CO2 24  GLUCOSE 127*  BUN 18  CREATININE 0.95  CALCIUM 8.7*   GFR: Estimated Creatinine Clearance: 56.4 mL/min (by C-G formula based on SCr of 0.95 mg/dL). Liver Function Tests: Recent Labs  Lab 10/20/21 1752  AST 17  ALT 12  ALKPHOS 103  BILITOT 0.5  PROT 7.3  ALBUMIN 3.8   No results for input(s): LIPASE, AMYLASE in the last 168 hours. No results for input(s): AMMONIA in the last 168 hours. Coagulation Profile: No results for input(s): INR, PROTIME in the last 168 hours. Cardiac Enzymes: No results for input(s): CKTOTAL, CKMB, CKMBINDEX, TROPONINI in the last 168 hours. BNP (last 3 results) No results for input(s): PROBNP in the last 8760 hours. HbA1C: No results for input(s): HGBA1C in the last 72 hours. CBG: No results for input(s): GLUCAP in the last 168 hours. Lipid Profile: No results for input(s): CHOL, HDL, LDLCALC, TRIG, CHOLHDL, LDLDIRECT in the last 72 hours. Thyroid Function Tests: No results for input(s): TSH, T4TOTAL, FREET4, T3FREE, THYROIDAB in the last 72 hours. Anemia Panel: No results for input(s): VITAMINB12, FOLATE, FERRITIN, TIBC, IRON, RETICCTPCT in the last 72 hours. Urine analysis:    Component Value Date/Time   COLORURINE YELLOW 01/13/2012 0419   APPEARANCEUR CLEAR 01/13/2012 0419   LABSPEC 1.018 01/13/2012 0419   PHURINE 5.5 01/13/2012 0419   GLUCOSEU NEGATIVE 01/13/2012 0419   HGBUR NEGATIVE 01/13/2012 0419   BILIRUBINUR NEGATIVE 01/13/2012 0419   KETONESUR NEGATIVE 01/13/2012 0419   PROTEINUR NEGATIVE 01/13/2012 0419   UROBILINOGEN 0.2 01/13/2012 0419   NITRITE NEGATIVE 01/13/2012 0419   LEUKOCYTESUR NEGATIVE 01/13/2012 0419   Sepsis Labs: @LABRCNTIP (procalcitonin:4,lacticidven:4) ) Recent Results (from the past 240 hour(s))  Resp Panel by RT-PCR (Flu A&B, Covid) Nasopharyngeal Swab     Status: None   Collection Time: 10/20/21  7:44 PM   Specimen:  Nasopharyngeal Swab; Nasopharyngeal(NP) swabs in vial transport medium  Result Value Ref Range Status   SARS Coronavirus 2 by RT PCR NEGATIVE NEGATIVE Final    Comment: (NOTE) SARS-CoV-2 target nucleic acids are NOT DETECTED.  The SARS-CoV-2 RNA is generally detectable in upper respiratory specimens during the acute phase of infection. The lowest concentration of SARS-CoV-2 viral copies this assay can detect is 138 copies/mL. A negative result does not preclude SARS-Cov-2 infection and should not be used as the sole basis for treatment or other patient management decisions. A negative result may occur with  improper specimen collection/handling, submission of specimen other than nasopharyngeal swab, presence of viral mutation(s) within the areas targeted by this assay, and inadequate number of viral copies(<138 copies/mL). A negative result must be combined with clinical observations, patient history, and epidemiological information. The expected result  is Negative.  Fact Sheet for Patients:  BloggerCourse.com  Fact Sheet for Healthcare Providers:  SeriousBroker.it  This test is no t yet approved or cleared by the [place] FDA and  has been authorized for detection and/or diagnosis of SARS-CoV-2 by FDA under an Emergency Use Authorization (EUA). This EUA will remain  in effect (meaning this test can be used) for the duration of the COVID-19 declaration under Section 564(b)(1) of the Act, 21 U.S.C.section 360bbb-3(b)(1), unless the authorization is terminated  or revoked sooner.       Influenza A by PCR NEGATIVE NEGATIVE Final   Influenza B by PCR NEGATIVE NEGATIVE Final    Comment: (NOTE) The Xpert Xpress SARS-CoV-2/FLU/RSV plus assay is intended as an aid in the diagnosis of influenza from Nasopharyngeal swab specimens and should not be used as a sole basis for treatment. Nasal washings and aspirates are unacceptable for  Xpert Xpress SARS-CoV-2/FLU/RSV testing.  Fact Sheet for Patients: BloggerCourse.com  Fact Sheet for Healthcare Providers: SeriousBroker.it  This test is not yet approved or cleared by the [place] FDA and has been authorized for detection and/or diagnosis of SARS-CoV-2 by FDA under an Emergency Use Authorization (EUA). This EUA will remain in effect (meaning this test can be used) for the duration of the COVID-19 declaration under Section 564(b)(1) of the Act, 21 U.S.C. section 360bbb-3(b)(1), unless the authorization is terminated or revoked.  Performed at [facility], [address], [place], [place] [address]      Radiological Exams on Admission: DG Chest 2 View  Result Date: 10/20/2021 CLINICAL DATA:  Shortness of breath for 3 weeks, worsening today. EXAM: CHEST - 2 VIEW COMPARISON:  Chest CT 01/13/2012 FINDINGS: Mild enlargement of the cardiopericardial silhouette, without edema. Mild thoracic spondylosis. Mild central airway thickening. The lungs appear otherwise clear. No blunting of the costophrenic angles. IMPRESSION: 1. Airway thickening is present, suggesting bronchitis or reactive airways disease. 2. Mild enlargement of the cardiopericardial silhouette, without edema. Electronically Signed   By: Gaylyn Rong M.D.   On: 10/20/2021 18:14    EKG: Independently reviewed.  Normal sinus rhythm.  Assessment/Plan Principal Problem:   Acute GI bleeding Active Problems:   Microcytic hypochromic anemia   Essential hypertension   OSA (obstructive sleep apnea)   Diabetes mellitus type 2 in obese (HCC)    GI bleed -given the microcytic hypochromic which is likely could be [place] chronic component.  We will keep n.p.o. past midnight in anticipation of procedure.  We will check serial CBC transfuse for hemoglobin less than 7. History of hypertrophic obstructive cardiomyopathy followed by cardiology.   Since patient has exertional symptoms which could be likely from symptomatic anemia however we will check 2D echo.  Presently on beta-blockers and calcium channel blockers. Microcytic hypochromic anemia check anemia panel follow CBC. History of sleep apnea on CPAP at bedtime. Diabetes mellitus type 2 we will keep patient on sliding scale coverage.   DVT prophylaxis: SCDs.  Avoiding anticoagulation in setting of GI bleed. Code Status: Full code. Family Communication: Husband at the bedside. Disposition Plan: Home. Consults called: Gastroenterology. Admission status: Observation.   Eduard Clos MD [facility] Pager [phone].  If 7PM-7AM, please contact night-coverage [email or web address] Password [facility]  10/20/2021, 8:49 PM

## 2021-10-21 ENCOUNTER — Observation Stay (HOSPITAL_BASED_OUTPATIENT_CLINIC_OR_DEPARTMENT_OTHER): Payer: 59

## 2021-10-21 DIAGNOSIS — K922 Gastrointestinal hemorrhage, unspecified: Secondary | ICD-10-CM | POA: Diagnosis not present

## 2021-10-21 DIAGNOSIS — I421 Obstructive hypertrophic cardiomyopathy: Secondary | ICD-10-CM

## 2021-10-21 LAB — BASIC METABOLIC PANEL
Anion gap: 8 (ref 5–15)
BUN: 16 mg/dL (ref 6–20)
CO2: 24 mmol/L (ref 22–32)
Calcium: 9 mg/dL (ref 8.9–10.3)
Chloride: 104 mmol/L (ref 98–111)
Creatinine, Ser: 0.82 mg/dL (ref 0.44–1.00)
GFR, Estimated: >60 mL/min (ref >60)
Glucose, Bld: 130 mg/dL — ABNORMAL HIGH (ref 70–99)
Potassium: 3.9 mmol/L (ref 3.5–5.1)
Sodium: 136 mmol/L (ref 135–145)

## 2021-10-21 LAB — CBC
HCT: 26.1 % — ABNORMAL LOW (ref 36.0–46.0)
HCT: 27.5 % — ABNORMAL LOW (ref 36.0–46.0)
Hemoglobin: 7.3 g/dL — ABNORMAL LOW (ref 12.0–15.0)
Hemoglobin: 7.7 g/dL — ABNORMAL LOW (ref 12.0–15.0)
MCH: 21.9 pg — ABNORMAL LOW (ref 26.0–34.0)
MCH: 22.1 pg — ABNORMAL LOW (ref 26.0–34.0)
MCHC: 28 g/dL — ABNORMAL LOW (ref 30.0–36.0)
MCHC: 28 g/dL — ABNORMAL LOW (ref 30.0–36.0)
MCV: 78.1 fL — ABNORMAL LOW (ref 80.0–100.0)
MCV: 79.1 fL — ABNORMAL LOW (ref 80.0–100.0)
Platelets: 309 10*3/uL (ref 150–400)
Platelets: 320 10*3/uL (ref 150–400)
RBC: 3.3 MIL/uL — ABNORMAL LOW (ref 3.87–5.11)
RBC: 3.52 MIL/uL — ABNORMAL LOW (ref 3.87–5.11)
RDW: 17.1 % — ABNORMAL HIGH (ref 11.5–15.5)
RDW: 17.2 % — ABNORMAL HIGH (ref 11.5–15.5)
WBC: 13.8 10*3/uL — ABNORMAL HIGH (ref 4.0–10.5)
WBC: 15.9 10*3/uL — ABNORMAL HIGH (ref 4.0–10.5)
nRBC: 0 % (ref 0.0–0.2)
nRBC: 0 % (ref 0.0–0.2)

## 2021-10-21 LAB — HIV ANTIBODY (ROUTINE TESTING W REFLEX): HIV Screen 4th Generation wRfx: NONREACTIVE

## 2021-10-21 LAB — GLUCOSE, CAPILLARY
Glucose-Capillary: 138 mg/dL — ABNORMAL HIGH (ref 70–99)
Glucose-Capillary: 112 mg/dL — ABNORMAL HIGH (ref 70–99)
Glucose-Capillary: 88 mg/dL (ref 70–99)
Glucose-Capillary: 115 mg/dL — ABNORMAL HIGH (ref 70–99)
Glucose-Capillary: 148 mg/dL — ABNORMAL HIGH (ref 70–99)

## 2021-10-21 MED ORDER — SODIUM CHLORIDE 0.9 % IV SOLN
510.0000 mg | Freq: Once | INTRAVENOUS | Status: AC
  Administered 2021-10-21: 510 mg via INTRAVENOUS
  Filled 2021-10-21: qty 17

## 2021-10-21 MED ORDER — MELATONIN 3 MG PO TABS
3.0000 mg | ORAL_TABLET | Freq: Every day | ORAL | Status: DC
  Administered 2021-10-21: 3 mg via ORAL
  Filled 2021-10-21: qty 1

## 2021-10-21 MED ORDER — MAGNESIUM OXIDE -MG SUPPLEMENT 400 (240 MG) MG PO TABS
400.0000 mg | ORAL_TABLET | Freq: Every day | ORAL | Status: DC
  Administered 2021-10-21 – 2021-10-22 (×2): 400 mg via ORAL
  Filled 2021-10-21 (×2): qty 1

## 2021-10-21 MED ORDER — ATORVASTATIN CALCIUM 40 MG PO TABS
40.0000 mg | ORAL_TABLET | Freq: Every day | ORAL | Status: DC
  Administered 2021-10-21 – 2021-10-22 (×2): 40 mg via ORAL
  Filled 2021-10-21 (×2): qty 1

## 2021-10-21 MED ORDER — PEG 3350-KCL-NA BICARB-NACL 420 G PO SOLR
4000.0000 mL | Freq: Once | ORAL | Status: AC
  Administered 2021-10-21: 4000 mL via ORAL
  Filled 2021-10-21: qty 4000

## 2021-10-21 NOTE — Progress Notes (Signed)
Pt refused cpap tonight, states she is not consistent with cpap at home either. Encouraged to let RN know if she changes her mind. Machine remained bedside.

## 2021-10-21 NOTE — Progress Notes (Signed)
Pt was supposed to get bowel prep started at around 1545 for her procedure tomorrow. Bowel prep was not received from pharmacy until after 2000. Bowel prep started at 2030. Per charge nurse, pt is to finish the prep by 3am 1/22 and then be NPO in order to be prepared for her procedure at 10am 1/22. Pt was informed of the new plan and verbalized understanding. Will inform day shift nurse in the morning.

## 2021-10-21 NOTE — Plan of Care (Signed)
  Problem: Education: Goal: Knowledge of General Education information will improve Description: Including pain rating scale, medication(s)/side effects and non-pharmacologic comfort measures Outcome: Progressing   Problem: Clinical Measurements: Goal: Will remain free from infection Outcome: Progressing Goal: Respiratory complications will improve Outcome: Progressing   Problem: Activity: Goal: Risk for activity intolerance will decrease Outcome: Progressing   

## 2021-10-21 NOTE — Consult Note (Signed)
Referring Provider: [facility] Primary Care Physician:  Scherrie Bateman Primary Gastroenterologist: Althia Forts  Reason for Consultation: Anemia  HPI: Beth Li is a 61 y.o. female with past medical history of diabetes and hypertrophic cardiomyopathy presented to the hospital with shortness of breath.  Complaining of intermittent dark stools.  Was found to have anemia with hemoglobin of 7.9.  Occult blood positive.  Iron study showed iron deficiency anemia GI is consulted for further evaluation.  Patient seen and examined at bedside.  Her sister is at bedside.  Patient with occasional reflux but denies any other GI symptoms.  She has been having some dark brown stool for last 1 week but denies any black tarry stool or bright red blood per rectum.  Denies abdominal pain, nausea or vomiting.  Denies unintentional weight loss.  No previous EGD or colonoscopy.  No family history of colon cancer  Past Medical History:  Diagnosis Date   Essential hypertension    GERD (gastroesophageal reflux disease)    Hyperlipidemia    Hypertrophic cardiomyopathy ([place])    Type 2 diabetes mellitus ([place])     Past Surgical History:  Procedure Laterality Date   NO PAST SURGERIES      Prior to Admission medications   Medication Sig Start Date End Date Taking? Authorizing Provider  amphetamine-dextroamphetamine (ADDERALL) 20 MG tablet Take 20 mg by mouth daily as needed (focus). 12/13/16  Yes [provider]  aspirin EC 81 MG tablet Take 81 mg by mouth daily.   Yes [provider]  atorvastatin (LIPITOR) 40 MG tablet Take 40 mg by mouth daily. 11/30/16  Yes [provider]  Cholecalciferol (VITAMIN D3 PO) Take 1 tablet by mouth daily.   Yes [provider]  diltiazem (CARDIZEM CD) 240 MG 24 hr capsule Take 1 capsule (240 mg total) by mouth in the morning. 10/04/21  Yes Satira Sark, MD  FARXIGA 10 MG TABS tablet Take 10 mg by mouth daily.  06/23/18  Yes [provider]  Magnesium Gluconate 500 (27 Mg) MG TABS Take 500 mg by mouth daily.   Yes [provider]  metoprolol tartrate (LOPRESSOR) 50 MG tablet Take 1 tablet (50 mg total) by mouth 2 (two) times daily. Patient taking differently: Take 50 mg by mouth at bedtime. 06/27/18 10/20/22 Yes Satira Sark, MD  traZODone (DESYREL) 100 MG tablet Take 100 mg by mouth at bedtime as needed for sleep. 07/09/18  Yes [provider]  zolpidem (AMBIEN) 10 MG tablet Take 10 mg by mouth at bedtime as needed for sleep. 01/04/17  Yes [provider]  DILT-XR 180 MG 24 hr capsule Take 180 mg by mouth 2 (two) times daily. Patient not taking: Reported on 10/20/2021 10/10/21   [provider]  ibuprofen (ADVIL,MOTRIN) 200 MG tablet Take 600 mg by mouth every 6 (six) hours as needed for mild pain or moderate pain.    [provider]    Scheduled Meds:  atorvastatin  40 mg Oral Daily   diltiazem  180 mg Oral BID   insulin aspart  0-9 Units Subcutaneous Q4H   magnesium oxide  400 mg Oral Daily   melatonin  3 mg Oral QHS   metoprolol tartrate  50 mg Oral BID   pantoprazole (PROTONIX) IV  40 mg Intravenous Q12H   Continuous Infusions: PRN Meds:.acetaminophen **OR** [DISCONTINUED] acetaminophen  Allergies as of 10/20/2021   (No Known Allergies)    Family History  Problem Relation Age of Onset  Heart disease Maternal Grandmother    Congestive Heart Failure Maternal Grandmother    Heart disease Mother    Heart attack Father    Congestive Heart Failure Sister    Heart attack Paternal Grandmother     Social History   Socioeconomic History   Marital status: Married    Spouse name: Not on file   Number of children: Not on file   Years of education: Not on file   Highest education level: Not on file  Occupational History   Not on file  Tobacco Use   Smoking status: Never   Smokeless tobacco: Never  Vaping Use   Vaping Use: Never used  Substance and  Sexual Activity   Alcohol use: Not Currently    Comment: Occasional   Drug use: No   Sexual activity: Not on file  Other Topics Concern   Not on file  Social History Narrative   Not on file   Social Determinants of Health   Financial Resource Strain: Not on file  Food Insecurity: Not on file  Transportation Needs: Not on file  Physical Activity: Not on file  Stress: Not on file  Social Connections: Not on file  Intimate Partner Violence: Not on file    Review of Systems: All negative except as stated above in HPI.  Physical Exam: Vital signs: Vitals:   10/21/21 0935 10/21/21 1300  BP: 124/62 117/70  Pulse: 69 76  Resp:  17  Temp:  99.8 F (37.7 C)  SpO2:  100%   Last BM Date: 10/20/21 General:   Alert,  Well-developed, well-nourished, pleasant and cooperative in NAD HEENT : NS, AT, EOMI Lungs:  Clear throughout to auscultation.   No wheezes, crackles, or rhonchi. No acute distress. Heart:  Regular rate and rhythm; no murmurs, clicks, rubs,  or gallops. Abdomen: Soft, nontender, nondistended, bowel sounds present, no peritoneal signs Neuro : Noted oriented x3 Psych : Mood and affect normal Rectal:  Deferred  GI:  Lab Results: Recent Labs    10/20/21 2010 10/20/21 2353 10/21/21 0403  WBC 14.7* 15.9* 13.8*  HGB 8.0* 7.3* 7.7*  HCT 28.2* 26.1* 27.5*  PLT 361 309 320   BMET Recent Labs    10/20/21 1752 10/21/21 0403  NA 135 136  K 3.4* 3.9  CL 103 104  CO2 24 24  GLUCOSE 127* 130*  BUN 18 16  CREATININE 0.95 0.82  CALCIUM 8.7* 9.0   LFT Recent Labs    10/20/21 1752  PROT 7.3  ALBUMIN 3.8  AST 17  ALT 12  ALKPHOS 103  BILITOT 0.5   PT/INR No results for input(s): LABPROT, INR in the last 72 hours.   Studies/Results: DG Chest 2 View  Result Date: 10/20/2021 CLINICAL DATA:  Shortness of breath for 3 weeks, worsening today. EXAM: CHEST - 2 VIEW COMPARISON:  Chest CT 01/13/2012 FINDINGS: Mild enlargement of the cardiopericardial  silhouette, without edema. Mild thoracic spondylosis. Mild central airway thickening. The lungs appear otherwise clear. No blunting of the costophrenic angles. IMPRESSION: 1. Airway thickening is present, suggesting bronchitis or reactive airways disease. 2. Mild enlargement of the cardiopericardial silhouette, without edema. Electronically Signed   By: Van Clines M.D.   On: 10/20/2021 18:14    Impression/Plan: -Anemia with heme positive stool -GERD -Hypertrophic cardiomyopathy  Recommendations ------------------------- -Plan for EGD and colonoscopy tomorrow. -Okay to have clear liquid diet today.  Keep n.p.o. past midnight. Risks (bleeding, infection, bowel perforation that could require surgery, sedation-related changes in cardiopulmonary  systems), benefits (identification and possible treatment of source of symptoms, exclusion of certain causes of symptoms), and alternatives (watchful waiting, radiographic imaging studies, empiric medical treatment)  were explained to patient/family in detail and patient wishes to proceed.       LOS: 0 days   Otis Brace  MD, FACP 10/21/2021, 2:29 PM  Contact #  [phone]

## 2021-10-21 NOTE — Progress Notes (Signed)
°  Echocardiogram 2D Echocardiogram has been performed.  Beth Li F 10/21/2021, 5:21 PM

## 2021-10-21 NOTE — Progress Notes (Signed)
Beth Li  SHU:[id number] DOB: [birth year] DOA: 10/20/2021 PCP: Avis Epley, PA-C    Brief Narrative:  [phone] with a history of hypertrophic cardiomyopathy, HTN, GERD, HLD, and DM2 who presented to the ER with worsening exertional shortness of breath over a 3-week timeframe with significant worsening of symptoms in the days immediately prior to her presentation.  When she also began to note dark stools she sought attention in the emergency room.  On presentation she was found to have a hemoglobin of 7.9 with a baseline of 15.3 in 2013.  She was occult blood positive on stool testing.  Consultants:  GI  Code Status: FULL CODE  Antimicrobials:  None  DVT prophylaxis: SCDs  Interim Hx: Afebrile.  Vital signs stable.  Saturation 100% on room air.  Hemoglobin 7.3-8.0.  Resting comfortably in bed.  Denies chest pain shortness of breath nausea vomiting or abdominal pain.  Assessment & Plan:  Acute versus subacute GI bleed GI consulted for endoscopic evaluation  Acute anemia due to blood loss -iron deficiency Anemia panel consistent with iron deficiency -ferritin quite low at 6 -transfuse as needed to keep hemoglobin at 7.0 or greater -load with IV iron during this admission  Hypertrophic obstructive cardiomyopathy Followed in outpatient setting by cardiology -appears well compensated at present -suspect exertional dyspnea was related to anemia -continue Cardizem and metoprolol as blood pressure will tolerate  Sleep apnea Continue usual CPAP regimen  HTN Blood pressure well controlled at present  DM2 CBG presently well controlled  Hyperlipidemia Continue Lipitor   Family Communication: Spoke with patient and her spouse at bedside Disposition: From home -anticipate return home when clinically stable   Objective: Blood pressure 124/62, pulse 69, temperature 99 F (37.2 C), temperature source Oral, resp. rate 16, height 5' (1.524 m), weight 71.7 kg, last menstrual  period 01/10/2012, SpO2 100 %. No intake or output data in the 24 hours ending 10/21/21 1038 Filed Weights   10/20/21 1749  Weight: 71.7 kg    Examination: General: No acute respiratory distress Lungs: Clear to auscultation bilaterally without wheezes or crackles Cardiovascular: Regular rate and rhythm without murmur gallop or rub normal S1 and S2 Abdomen: Nontender, nondistended, soft, bowel sounds positive, no rebound, no ascites, no appreciable mass Extremities: No significant cyanosis, clubbing, or edema bilateral lower extremities  CBC: Recent Labs  Lab 10/20/21 1752 10/20/21 2010 10/20/21 2353 10/21/21 0403  WBC 14.8* 14.7* 15.9* 13.8*  NEUTROABS 9.5*  --   --   --   HGB 7.9* 8.0* 7.3* 7.7*  HCT 27.6* 28.2* 26.1* 27.5*  MCV 78.2* 78.1* 79.1* 78.1*  PLT 331 361 309 320   Basic Metabolic Panel: Recent Labs  Lab 10/20/21 1752 10/21/21 0403  NA 135 136  K 3.4* 3.9  CL 103 104  CO2 24 24  GLUCOSE 127* 130*  BUN 18 16  CREATININE 0.95 0.82  CALCIUM 8.7* 9.0   GFR: Estimated Creatinine Clearance: 65.3 mL/min (by C-G formula based on SCr of 0.82 mg/dL).  Liver Function Tests: Recent Labs  Lab 10/20/21 1752  AST 17  ALT 12  ALKPHOS 103  BILITOT 0.5  PROT 7.3  ALBUMIN 3.8    CBG: Recent Labs  Lab 10/20/21 2200 10/21/21 0116 10/21/21 0403  GLUCAP 110* 138* 112*    Scheduled Meds:  diltiazem  180 mg Oral BID   insulin aspart  0-9 Units Subcutaneous Q4H   melatonin  3 mg Oral QHS   metoprolol tartrate  50 mg Oral BID  pantoprazole (PROTONIX) IV  40 mg Intravenous Q12H     LOS: 0 days   Lonia Blood, MD [facility] Office  [phone] Pager - Text Page per Amion  If 7PM-7AM, please contact night-coverage per Amion 10/21/2021, 10:38 AM

## 2021-10-22 ENCOUNTER — Encounter (HOSPITAL_COMMUNITY)
Admission: EM | Disposition: A | Discharge status: Home / Self Care | Payer: Self-pay | Source: Home / Self Care | Attending: Emergency Medicine

## 2021-10-22 ENCOUNTER — Observation Stay (HOSPITAL_COMMUNITY): Payer: 59 | Admitting: Registered Nurse

## 2021-10-22 ENCOUNTER — Encounter (HOSPITAL_COMMUNITY): Payer: Self-pay | Admitting: Internal Medicine

## 2021-10-22 DIAGNOSIS — K922 Gastrointestinal hemorrhage, unspecified: Secondary | ICD-10-CM | POA: Diagnosis not present

## 2021-10-22 HISTORY — PX: COLONOSCOPY: SHX5424

## 2021-10-22 HISTORY — PX: ESOPHAGOGASTRODUODENOSCOPY (EGD) WITH PROPOFOL: SHX5813

## 2021-10-22 HISTORY — PX: BIOPSY: SHX5522

## 2021-10-22 LAB — ECHOCARDIOGRAM COMPLETE
AR max vel: 3.09 cm2
AV Area VTI: 3.07 cm2
AV Area mean vel: 2.92 cm2
AV Mean grad: 9 mmHg
AV Peak grad: 14.9 mmHg
Ao pk vel: 1.93 m/s
Area-P 1/2: 3.06 cm2
Height: 60 in
S' Lateral: 2.3 cm
Weight: 2528 oz

## 2021-10-22 LAB — CBC
HCT: 26.5 % — ABNORMAL LOW (ref 36.0–46.0)
Hemoglobin: 7.7 g/dL — ABNORMAL LOW (ref 12.0–15.0)
MCH: 22.8 pg — ABNORMAL LOW (ref 26.0–34.0)
MCHC: 29.1 g/dL — ABNORMAL LOW (ref 30.0–36.0)
MCV: 78.4 fL — ABNORMAL LOW (ref 80.0–100.0)
Platelets: 321 10*3/uL (ref 150–400)
RBC: 3.38 MIL/uL — ABNORMAL LOW (ref 3.87–5.11)
RDW: 17.1 % — ABNORMAL HIGH (ref 11.5–15.5)
WBC: 12.5 10*3/uL — ABNORMAL HIGH (ref 4.0–10.5)
nRBC: 0 % (ref 0.0–0.2)

## 2021-10-22 LAB — GLUCOSE, CAPILLARY
Glucose-Capillary: 100 mg/dL — ABNORMAL HIGH (ref 70–99)
Glucose-Capillary: 101 mg/dL — ABNORMAL HIGH (ref 70–99)
Glucose-Capillary: 119 mg/dL — ABNORMAL HIGH (ref 70–99)
Glucose-Capillary: 122 mg/dL — ABNORMAL HIGH (ref 70–99)

## 2021-10-22 LAB — BASIC METABOLIC PANEL
Anion gap: 8 (ref 5–15)
BUN: 13 mg/dL (ref 6–20)
CO2: 24 mmol/L (ref 22–32)
Calcium: 9.1 mg/dL (ref 8.9–10.3)
Chloride: 104 mmol/L (ref 98–111)
Creatinine, Ser: 0.83 mg/dL (ref 0.44–1.00)
GFR, Estimated: >60 mL/min (ref >60)
Glucose, Bld: 130 mg/dL — ABNORMAL HIGH (ref 70–99)
Potassium: 4.1 mmol/L (ref 3.5–5.1)
Sodium: 136 mmol/L (ref 135–145)

## 2021-10-22 LAB — MAGNESIUM: Magnesium: 2 mg/dL (ref 1.7–2.4)

## 2021-10-22 SURGERY — ESOPHAGOGASTRODUODENOSCOPY (EGD) WITH PROPOFOL
Anesthesia: Monitor Anesthesia Care

## 2021-10-22 MED ORDER — ASPIRIN EC 81 MG PO TBEC: 81.0000 mg | DELAYED_RELEASE_TABLET | Freq: Every day | ORAL | 11 refills | Status: AC

## 2021-10-22 MED ORDER — PROPOFOL 500 MG/50ML IV EMUL
INTRAVENOUS | Status: DC | PRN
  Administered 2021-10-22: 250 ug/kg/min via INTRAVENOUS

## 2021-10-22 MED ORDER — FERROUS SULFATE 325 (65 FE) MG PO TABS: 325.0000 mg | ORAL_TABLET | Freq: Every day | ORAL | 1 refills | Status: DC

## 2021-10-22 MED ORDER — SODIUM CHLORIDE 0.9 % IV SOLN: INTRAVENOUS | Status: DC

## 2021-10-22 MED ORDER — GLYCOPYRROLATE 0.2 MG/ML IJ SOLN
INTRAMUSCULAR | Status: DC | PRN
  Administered 2021-10-22: .1 mg via INTRAVENOUS

## 2021-10-22 MED ORDER — LIDOCAINE HCL (CARDIAC) PF 100 MG/5ML IV SOSY
PREFILLED_SYRINGE | INTRAVENOUS | Status: DC | PRN
  Administered 2021-10-22: 100 mg via INTRAVENOUS

## 2021-10-22 MED ORDER — ONDANSETRON HCL 4 MG/2ML IJ SOLN
INTRAMUSCULAR | Status: DC | PRN
  Administered 2021-10-22: 4 mg via INTRAVENOUS

## 2021-10-22 MED ORDER — LACTATED RINGERS IV SOLN: INTRAVENOUS | Status: DC

## 2021-10-22 MED ORDER — ACETAMINOPHEN 325 MG PO TABS: 650.0000 mg | ORAL_TABLET | Freq: Four times a day (QID) | ORAL | Status: DC | PRN

## 2021-10-22 MED ORDER — LACTATED RINGERS IV SOLN: INTRAVENOUS | Status: DC | PRN

## 2021-10-22 SURGICAL SUPPLY — 15 items

## 2021-10-22 NOTE — Discharge Summary (Signed)
DISCHARGE SUMMARY  Beth Li  MR#: [id number]  DOB:[birth year]  Date of Admission: 10/20/2021 Date of Discharge: 10/22/2021  Attending Physician:Alegria Dominique Silvestre Gunner, MD  Patient's PRF:FMBWGYK, [place], PA-C  Consults: GI  Disposition: D/C home   Follow-up Appts:  Follow-up Information     Avis Epley, PA-C Follow up in 3 day(s).   Specialty: Family Medicine Why: Schedule an appointment to have your blood count checked in 3-5 days, as we discussed. Contact information: [address] [place] [address] [phone]                 Tests Needing Follow-up: -f/u CBC is suggested in 3-5 days   Discharge Diagnoses: Acute versus subacute GI bleed Acute anemia due to blood loss - iron deficiency Hypertrophic obstructive cardiomyopathy Sleep apnea HTN DM2 Hyperlipidemia  Initial presentation: 60yo with a history of hypertrophic cardiomyopathy, HTN, GERD, HLD, and DM2 who presented to the ER with worsening exertional shortness of breath over a 3-week timeframe with significant worsening of symptoms in the days immediately prior to her presentation.  When she also began to note dark stools she sought attention in the emergency room.  On presentation she was found to have a hemoglobin of 7.9 with a baseline of 15.3 in 2013.  She was occult blood positive on stool testing.  Hospital Course:  Acute versus subacute GI bleed Patient underwent EGD and colonoscopy during this hospital stay with no definitive source of blood loss appreciated -she was found to have internal hemorrhoids and diverticulosis-she remained hemodynamically stable -she did not require blood transfusion -the possibility of small bilateral AVMs is entertained on the differential, and if the patient continues to experience significant anemia a capsule endoscopy would be the next recommended intervention -the patient was counseled at length as to the need to monitor her stools, and  counseled on warning symptoms of significant blood loss/hypovolemia that should prompt an immediate return to the ED   Acute anemia due to blood loss - iron deficiency Anemia panel consistent with iron deficiency -ferritin quite low at 6 -with no history of CAD the patient's transfusion threshold was 7.0 as she did not drop below this during her hospital stay - loaded with IV iron during this admission -advised to add supplemental oral iron in approximately 5 days, to allow her to monitor her stools at home over that time without fear of oral iron confusing the picture -patient advised to hold her aspirin for 1 week and to also avoid NSAIDs for at least 1 week   Hypertrophic obstructive cardiomyopathy Followed in outpatient setting by Cardiology - well compensated at present - suspect exertional dyspnea was related to anemia - continue Cardizem and metoprolol    Sleep apnea Continue usual CPAP regimen (pt admits to inconsistent use at home)    HTN Blood pressure controlled    DM2 CBG controlled   Hyperlipidemia Continue Lipitor    Allergies as of 10/22/2021   No Known Allergies      Medication List     STOP taking these medications    ibuprofen 200 MG tablet Commonly known as: ADVIL       TAKE these medications    acetaminophen 325 MG tablet Commonly known as: TYLENOL Take 2 tablets (650 mg total) by mouth every 6 (six) hours as needed for mild pain (or Fever >/= 101).   amphetamine-dextroamphetamine 20 MG tablet Commonly known as: ADDERALL Take 20 mg by mouth daily as needed (focus).   aspirin EC  81 MG tablet Take 1 tablet (81 mg total) by mouth daily. Start taking on: October 29, 2021 What changed: These instructions start on October 29, 2021. If you are unsure what to do until then, ask your doctor or other care provider.   atorvastatin 40 MG tablet Commonly known as: LIPITOR Take 40 mg by mouth daily.   Dilt-XR 180 MG 24 hr capsule Generic drug:  diltiazem Take 180 mg by mouth 2 (two) times daily.   diltiazem 240 MG 24 hr capsule Commonly known as: Cardizem CD Take 1 capsule (240 mg total) by mouth in the morning.   Farxiga 10 MG Tabs tablet Generic drug: dapagliflozin propanediol Take 10 mg by mouth daily.   ferrous sulfate 325 (65 FE) MG tablet Take 1 tablet (325 mg total) by mouth daily. Start taking on: October 27, 2021   Magnesium Gluconate 500 (27 Mg) MG Tabs Take 500 mg by mouth daily.   metoprolol tartrate 50 MG tablet Commonly known as: LOPRESSOR Take 1 tablet (50 mg total) by mouth 2 (two) times daily. What changed: when to take this   traZODone 100 MG tablet Commonly known as: DESYREL Take 100 mg by mouth at bedtime as needed for sleep.   VITAMIN D3 PO Take 1 tablet by mouth daily.   zolpidem 10 MG tablet Commonly known as: AMBIEN Take 10 mg by mouth at bedtime as needed for sleep.        Day of Discharge BP 126/80    Pulse 97    Temp 98 F (36.7 C) (Oral)    Resp 20    Ht 5' (1.524 m)    Wt 71.7 kg    LMP 01/10/2012    SpO2 97%    BMI 30.86 kg/m   Physical Exam: General: No acute respiratory distress Lungs: Clear to auscultation bilaterally without wheezes or crackles Cardiovascular: Regular rate and rhythm without murmur gallop or rub normal S1 and S2 Abdomen: Nontender, nondistended, soft, bowel sounds positive, no rebound, no ascites, no appreciable mass Extremities: No significant cyanosis, clubbing, or edema bilateral lower extremities  Basic Metabolic Panel: Recent Labs  Lab 10/20/21 1752 10/21/21 0403 10/22/21 0337  NA 135 136 136  K 3.4* 3.9 4.1  CL 103 104 104  CO2 24 24 24   GLUCOSE 127* 130* 130*  BUN 18 16 13   CREATININE 0.95 0.82 0.83  CALCIUM 8.7* 9.0 9.1  MG  --   --  2.0    Liver Function Tests: Recent Labs  Lab 10/20/21 1752  AST 17  ALT 12  ALKPHOS 103  BILITOT 0.5  PROT 7.3  ALBUMIN 3.8    CBC: Recent Labs  Lab 10/20/21 1752 10/20/21 2010  10/20/21 2353 10/21/21 0403 10/22/21 0337  WBC 14.8* 14.7* 15.9* 13.8* 12.5*  NEUTROABS 9.5*  --   --   --   --   HGB 7.9* 8.0* 7.3* 7.7* 7.7*  HCT 27.6* 28.2* 26.1* 27.5* 26.5*  MCV 78.2* 78.1* 79.1* 78.1* 78.4*  PLT 331 361 309 320 321    BNP (last 3 results) Recent Labs    10/20/21 2236  BNP 35.6    CBG: Recent Labs  Lab 10/21/21 2019 10/22/21 0005 10/22/21 0417 10/22/21 0818 10/22/21 1154  GLUCAP 148* 122* 119* 101* 100*    Time spent in discharge (includes decision making & examination of pt): 30 minutes  10/22/2021, 3:21 PM   10/24/21, MD [facility] Office  [phone]

## 2021-10-22 NOTE — Transfer of Care (Signed)
Immediate Anesthesia Transfer of Care Note  Patient: Beth Li  Procedure(s) Performed: ESOPHAGOGASTRODUODENOSCOPY (EGD) WITH PROPOFOL COLONOSCOPY BIOPSY  Patient Location: PACU  Anesthesia Type:MAC  Level of Consciousness: awake, alert , oriented and patient cooperative  Airway & Oxygen Therapy: Patient Spontanous Breathing and Patient connected to face mask oxygen  Post-op Assessment: Report given to RN, Post -op Vital signs reviewed and stable and Patient moving all extremities X 4  Post vital signs: stable  Last Vitals:  Vitals Value Taken Time  BP    Temp    Pulse    Resp    SpO2      Last Pain:  Vitals:   10/22/21 1007  TempSrc: Temporal  PainSc: 0-No pain         Complications: No notable events documented.

## 2021-10-22 NOTE — Op Note (Signed)
[facility] Patient Name: Beth Li Procedure Date: 10/22/2021 MRN: [id number] Attending MD: Kathi Der , MD Date of Birth: May 01, 1961 CSN: [id number] Age: 61 Admit Type: Inpatient Procedure:                Upper GI endoscopy Indications:              Iron deficiency anemia, Heme positive stool Providers:                Kathi Der, MD, Vicki Mallet, RN, Kandice Robinsons, Technician Referring MD:              Medicines:                Sedation Administered by an Anesthesia Professional Complications:            No immediate complications. Estimated Blood Loss:     Estimated blood loss was minimal. Procedure:                Pre-Anesthesia Assessment:                           - Prior to the procedure, a History and Physical                            was performed, and patient medications and                            allergies were reviewed. The patient's tolerance of                            previous anesthesia was also reviewed. The risks                            and benefits of the procedure and the sedation                            options and risks were discussed with the patient.                            All questions were answered, and informed consent                            was obtained. Prior Anticoagulants: The patient has                            taken no previous anticoagulant or antiplatelet                            agents. ASA Grade Assessment: II - A patient with                            mild systemic disease. After reviewing the risks  and benefits, the patient was deemed in                            satisfactory condition to undergo the procedure.                           After obtaining informed consent, the endoscope was                            passed under direct vision. Throughout the                            procedure, the patient's blood pressure, pulse,  and                            oxygen saturations were monitored continuously. The                            [id number]-[id number] ([id number]) Olympus endoscope was introduced                            through the mouth, and advanced to the second part                            of duodenum. The upper GI endoscopy was                            accomplished without difficulty. The patient                            tolerated the procedure well. Scope In: Scope Out: Findings:      The Z-line was regular and was found 38 cm from the incisors.      Normal mucosa was found in the entire examined stomach. Biopsies were       taken with a cold forceps for histology.      The cardia and gastric fundus were normal on retroflexion.      The duodenal bulb, first portion of the duodenum and second portion of       the duodenum were normal. Impression:               - Z-line regular, 38 cm from the incisors.                           - Normal mucosa was found in the entire stomach.                            Biopsied.                           - Normal duodenal bulb, first portion of the                            duodenum and second portion of the duodenum. Moderate Sedation:      Moderate (conscious) sedation was personally administered by  an       anesthesia professional. The following parameters were monitored: oxygen       saturation, heart rate, blood pressure, and response to care. Recommendation:           - Perform a colonoscopy today. Procedure Code(s):        --- Professional ---                           [phone], Esophagogastroduodenoscopy, flexible,                            transoral; with biopsy, single or multiple Diagnosis Code(s):        --- Professional ---                           D50.9, Iron deficiency anemia, unspecified                           R19.5, Other fecal abnormalities CPT copyright 2019 American Medical Association. All rights reserved. The codes documented in this report are  preliminary and upon coder review may  be revised to meet current compliance requirements. Kathi DerParag Barb Shear, MD Kathi DerParag Stina Gane, MD 10/22/2021 10:49:24 AM Number of Addenda: 0

## 2021-10-22 NOTE — Discharge Instructions (Signed)
Do not take your usual dose of aspirin for 1 week.   Do not take ibuprofen, motrin, aleve, advil, or any other NSAID medication for 1 week.  Tylenol is ok for minor aches and pains as needed.

## 2021-10-22 NOTE — Brief Op Note (Signed)
10/20/2021 - 10/22/2021  10:54 AM  PATIENT:  Elnita Maxwell A Szeliga  61 y.o. female  PRE-OPERATIVE DIAGNOSIS:  Anemia  POST-OPERATIVE DIAGNOSIS:  EGD gastric biopsy COLON divertic, hemorrh, fair prep  PROCEDURE:  Procedure(s): ESOPHAGOGASTRODUODENOSCOPY (EGD) WITH PROPOFOL (N/A) COLONOSCOPY (N/A) BIOPSY  SURGEON:  Surgeon(s) and Role:    * Dalanie Kisner, MD - Primary  Finding ----------- -EGD was normal.  Gastric biopsies performed to rule out H. pylori because of anemia -Colonoscopy showed fair prep, diverticulosis and internal hemorrhoids.  Normal terminal ileum.  No evidence of bleeding.  Recommendations ------------------------- -Start soft diet and advance as tolerated -Recommend daily iron supplements. -Recommend outpatient capsule endoscopy if persistent anemia -No further inpatient GI work-up planned. -Okay to discharge from GI standpoint.  GI will sign off.  Follow-up in GI clinic in 2 to 3 months after discharge.  Kathi Der MD, FACP 10/22/2021, 10:55 AM  Contact #  [phone]

## 2021-10-22 NOTE — Anesthesia Postprocedure Evaluation (Signed)
Anesthesia Post Note  Patient: Beth Li) Performed: ESOPHAGOGASTRODUODENOSCOPY (EGD) WITH PROPOFOL COLONOSCOPY BIOPSY     Patient location during evaluation: Endoscopy Anesthesia Type: MAC Level of consciousness: awake and alert Pain management: pain level controlled Vital Signs Assessment: post-procedure vital signs reviewed and stable Respiratory status: spontaneous breathing, nonlabored ventilation and respiratory function stable Cardiovascular status: stable and blood pressure returned to baseline Postop Assessment: no apparent nausea or vomiting Anesthetic complications: no   No notable events documented.  Last Vitals:  Vitals:   10/22/21 1109 10/22/21 1114  BP: 120/71 128/67  Pulse: 70 72  Resp: 16 17  Temp:    SpO2: 99% 97%    Last Pain:  Vitals:   10/22/21 1055  TempSrc:   PainSc: 0-No pain                 Gwendelyn Lanting,W. EDMOND

## 2021-10-22 NOTE — Anesthesia Preprocedure Evaluation (Addendum)
Anesthesia Evaluation  Patient identified by MRN, date of birth, ID band Patient awake    Reviewed: Allergy & Precautions, H&P , NPO status , Patient's Chart, lab work & pertinent test results, reviewed documented beta blocker date and time   Airway Mallampati: II  TM Distance: >3 FB Neck ROM: Full    Dental no notable dental hx. (+) Teeth Intact, Dental Advisory Given   Pulmonary neg pulmonary ROS,    Pulmonary exam normal breath sounds clear to auscultation       Cardiovascular hypertension, Pt. on medications and Pt. on home beta blockers  Rhythm:Regular Rate:Normal  Hypertrophic cardiomyopathy   Neuro/Psych negative neurological ROS  negative psych ROS   GI/Hepatic Neg liver ROS, GERD  Medicated,  Endo/Other  diabetes, Type 2, Oral Hypoglycemic Agents  Renal/GU negative Renal ROS  negative genitourinary   Musculoskeletal   Abdominal   Peds  Hematology  (+) Blood dyscrasia, anemia ,   Anesthesia Other Findings   Reproductive/Obstetrics negative OB ROS                            Anesthesia Physical Anesthesia Plan  ASA: 3  Anesthesia Plan: MAC   Post-op Pain Management: Minimal or no pain anticipated   Induction: Intravenous  PONV Risk Score and Plan: 2 and Propofol infusion and Treatment may vary due to age or medical condition  Airway Management Planned: Natural Airway and Simple Face Mask  Additional Equipment:   Intra-op Plan:   Post-operative Plan:   Informed Consent: I have reviewed the patients History and Physical, chart, labs and discussed the procedure including the risks, benefits and alternatives for the proposed anesthesia with the patient or authorized representative who has indicated his/her understanding and acceptance.     Dental advisory given  Plan Discussed with: CRNA  Anesthesia Plan Comments:         Anesthesia Quick Evaluation

## 2021-10-22 NOTE — Anesthesia Procedure Notes (Signed)
Procedure Name: MAC Date/Time: 10/22/2021 11:06 AM Performed by: Lissa Morales, CRNA Pre-anesthesia Checklist: Patient identified, Emergency Drugs available, Suction available, Patient being monitored and Timeout performed Patient Re-evaluated:Patient Re-evaluated prior to induction Oxygen Delivery Method: Simple face mask Preoxygenation: Pre-oxygenation with 100% oxygen Placement Confirmation: positive ETCO2

## 2021-10-22 NOTE — Op Note (Signed)
[facility] Patient Name: Beth Li Procedure Date: 10/22/2021 MRN: [id number] Attending MD: Kathi Der , MD Date of Birth: [birth year] CSN: [id number] Age: 61 Admit Type: Inpatient Procedure:                Colonoscopy Indications:              Heme positive stool, Iron deficiency anemia Providers:                Kathi Der, MD, Vicki Mallet, RN, Kandice Robinsons, Technician Referring MD:              Medicines:                Sedation Administered by an Anesthesia Professional Complications:            No immediate complications. Estimated Blood Loss:     Estimated blood loss was minimal. Procedure:                Pre-Anesthesia Assessment:                           - Prior to the procedure, a History and Physical                            was performed, and patient medications and                            allergies were reviewed. The patient's tolerance of                            previous anesthesia was also reviewed. The risks                            and benefits of the procedure and the sedation                            options and risks were discussed with the patient.                            All questions were answered, and informed consent                            was obtained. Prior Anticoagulants: The patient has                            taken no previous anticoagulant or antiplatelet                            agents. ASA Grade Assessment: II - A patient with                            mild systemic disease. After reviewing the risks  and benefits, the patient was deemed in                            satisfactory condition to undergo the procedure.                           - Prior to the procedure, a History and Physical                            was performed, and patient medications and                            allergies were reviewed. The patient's tolerance of                             previous anesthesia was also reviewed. The risks                            and benefits of the procedure and the sedation                            options and risks were discussed with the patient.                            All questions were answered, and informed consent                            was obtained. Prior Anticoagulants: The patient has                            taken no previous anticoagulant or antiplatelet                            agents. ASA Grade Assessment: II - A patient with                            mild systemic disease. After reviewing the risks                            and benefits, the patient was deemed in                            satisfactory condition to undergo the procedure.                           After obtaining informed consent, the colonoscope                            was passed under direct vision. Throughout the                            procedure, the patient's blood pressure, pulse, and  oxygen saturations were monitored continuously. The                            PCF-HQ190L ([id number] Olympus colonoscope was                            introduced through the anus and advanced to the the                            terminal ileum, with identification of the                            appendiceal orifice and IC valve. The colonoscopy                            was performed without difficulty. The patient                            tolerated the procedure well. The quality of the                            bowel preparation was fair. Scope In: 10:34:28 AM Scope Out: 10:43:51 AM Scope Withdrawal Time: 0 hours 5 minutes 23 seconds  Total Procedure Duration: 0 hours 9 minutes 23 seconds  Findings:      Skin tags were found on perianal exam.      The terminal ileum appeared normal.      Scattered small-mouthed diverticula were found in the sigmoid colon and       descending colon.       Internal hemorrhoids were found during retroflexion. The hemorrhoids       were small. Impression:               - Preparation of the colon was fair.                           - Perianal skin tags found on perianal exam.                           - The examined portion of the ileum was normal.                           - Diverticulosis in the sigmoid colon and in the                            descending colon.                           - Internal hemorrhoids.                           - No specimens collected. Moderate Sedation:      Moderate (conscious) sedation was personally administered by an       anesthesia professional. The following parameters were monitored: oxygen       saturation, heart rate, blood pressure, and response to care. Recommendation:           -  Patient has a contact number available for                            emergencies. The signs and symptoms of potential                            delayed complications were discussed with the                            patient. Return to normal activities tomorrow.                            Written discharge instructions were provided to the                            patient.                           - Resume previous diet.                           - Continue present medications.                           - Await pathology results.                           - Repeat colonoscopy in 3 years for screening                            purposes.                           - Return to my office in 3 months.                           - To visualize the small bowel, perform video                            capsule endoscopy as an outpatient if persistent                            anemia. Procedure Code(s):        --- Professional ---                           [phone], Colonoscopy, flexible; diagnostic, including                            collection of specimen(s) by brushing or washing,                            when performed  (separate procedure) Diagnosis Code(s):        --- Professional ---                           U04.5K64.8, Other  hemorrhoids                           K64.4, Residual hemorrhoidal skin tags                           R19.5, Other fecal abnormalities                           D50.9, Iron deficiency anemia, unspecified                           K57.30, Diverticulosis of large intestine without                            perforation or abscess without bleeding CPT copyright 2019 [employer] Association. All rights reserved. The codes documented in this report are preliminary and upon coder review may  be revised to meet current compliance requirements. Kathi DerParag Bintou Lafata, MD Kathi DerParag Ludwin Flahive, MD 10/22/2021 10:53:30 AM Number of Addenda: 0

## 2021-10-22 NOTE — Plan of Care (Signed)

## 2021-10-23 ENCOUNTER — Other Ambulatory Visit: Payer: 59

## 2021-10-23 ENCOUNTER — Encounter (HOSPITAL_COMMUNITY): Payer: Self-pay | Admitting: Gastroenterology

## 2021-10-24 LAB — TYPE AND SCREEN
ABO/RH(D): O POS
Antibody Screen: NEGATIVE
Unit division: 0

## 2021-10-24 LAB — BPAM RBC
Blood Product Expiration Date: 202302192359
Unit Type and Rh: 5100

## 2021-10-26 LAB — SURGICAL PATHOLOGY

## 2021-11-07 ENCOUNTER — Other Ambulatory Visit: Payer: 59

## 2021-11-16 ENCOUNTER — Ambulatory Visit (HOSPITAL_COMMUNITY): Payer: 59

## 2022-02-19 IMAGING — CR DG CHEST 2V
2 series · 2 of 2 positions shown · non-contrast
Comparison: Chest CT 01/13/2012

CLINICAL DATA: Shortness of breath for 3 weeks, worsening today.

EXAM:
CHEST - 2 VIEW

[w chest pa]
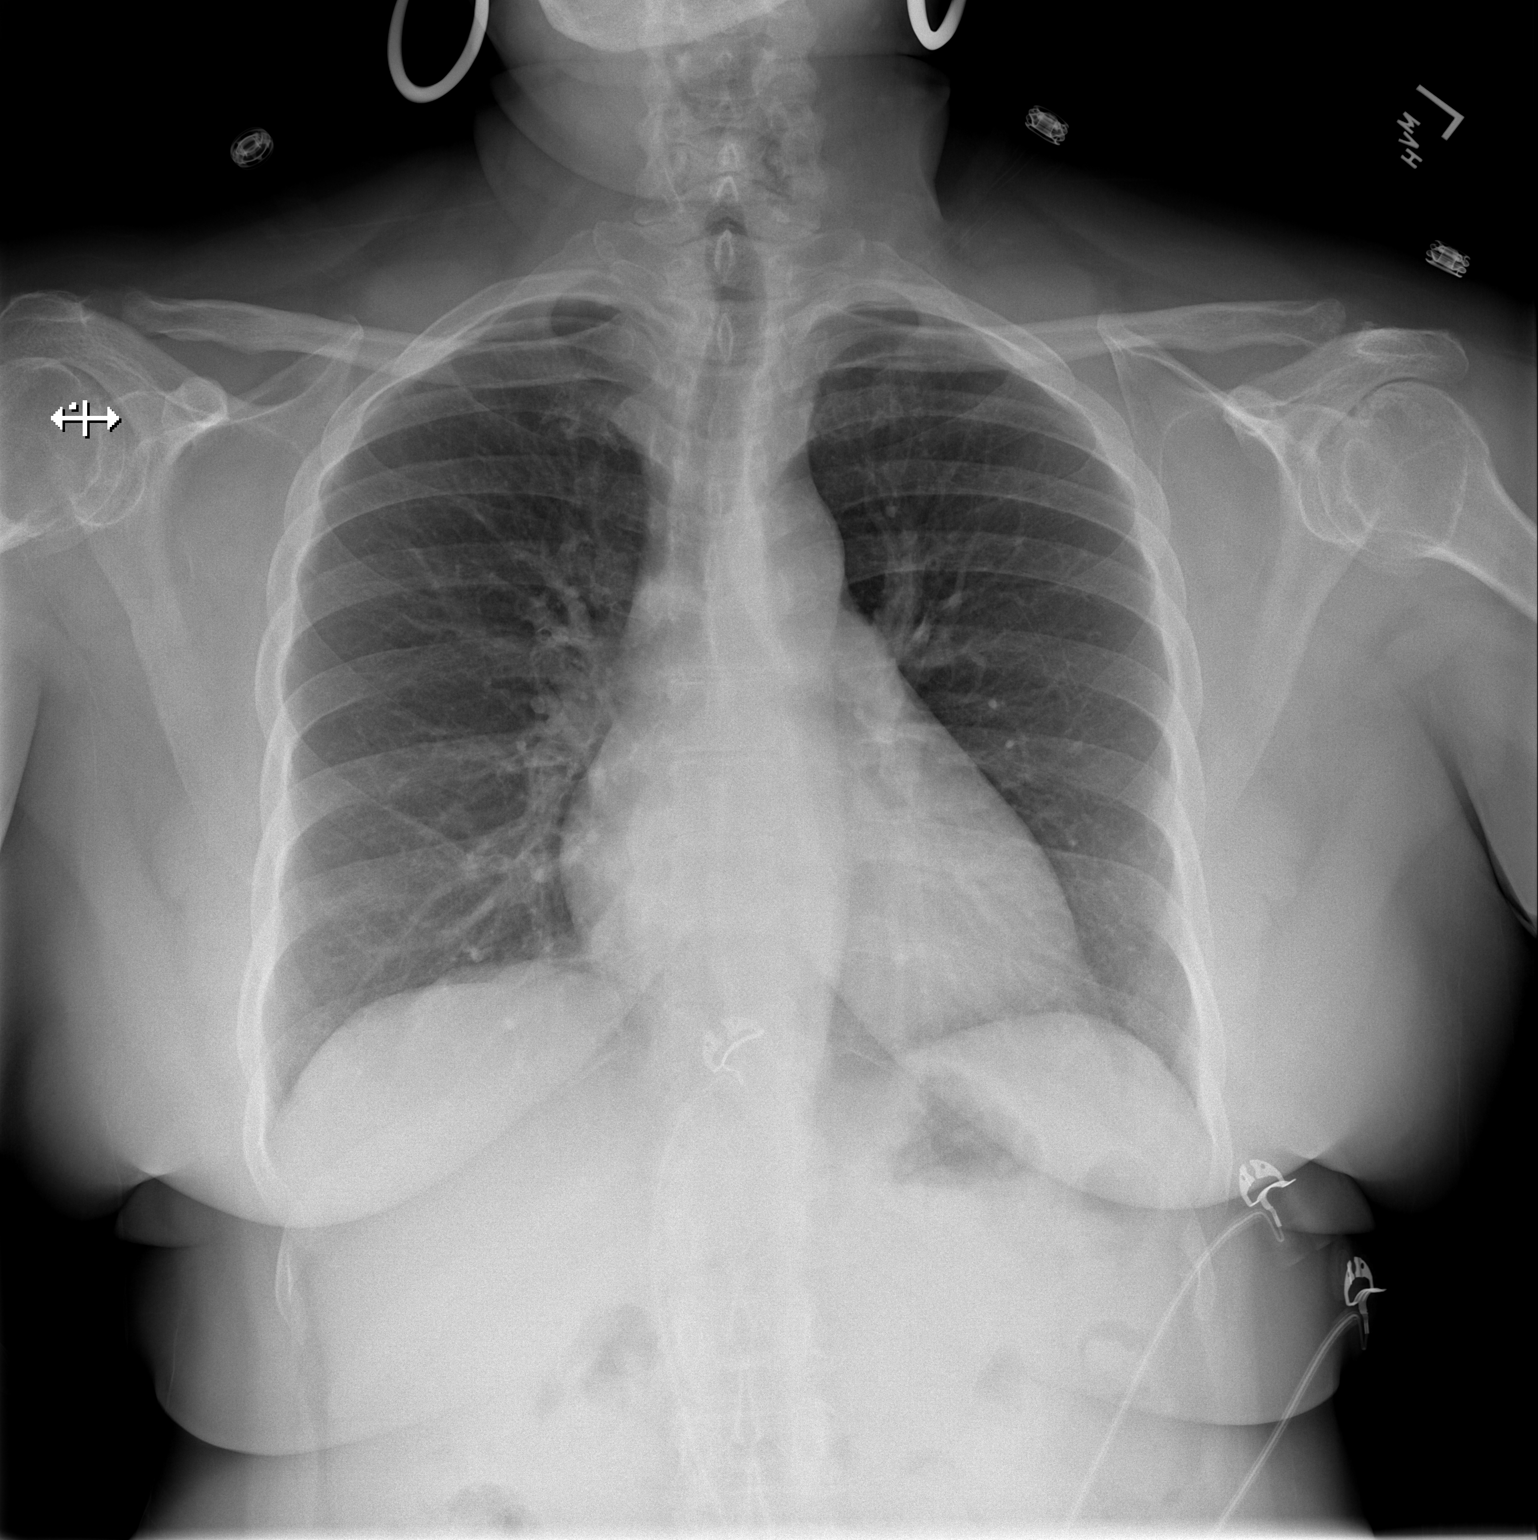

[w chest lat]
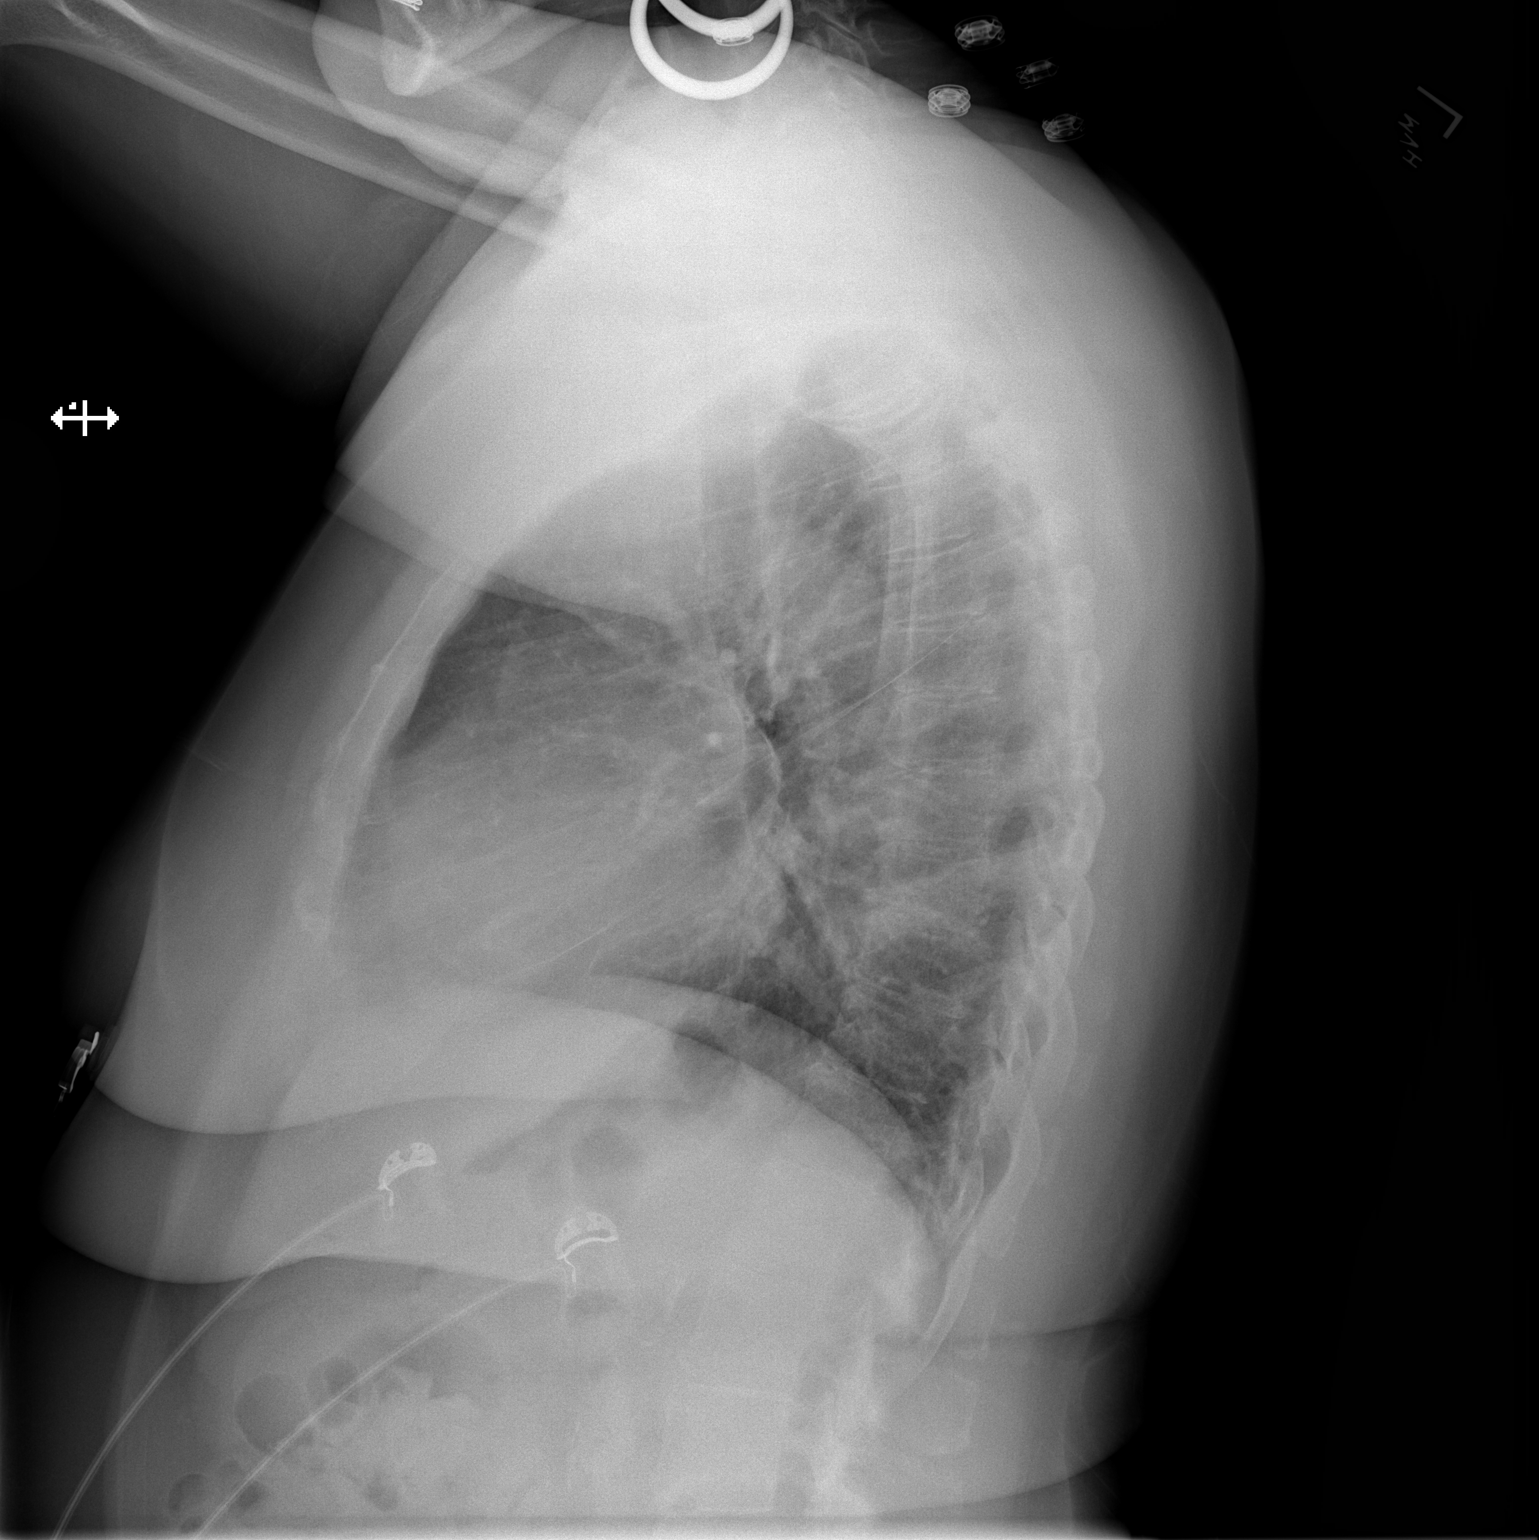

[2 of 2 positions shown; findings below may reference images not displayed]

FINDINGS: Mild enlargement of the cardiopericardial silhouette, without edema.
Mild thoracic spondylosis.

Mild central airway thickening. The lungs appear otherwise clear. No
blunting of the costophrenic angles.
IMPRESSION: 1. Airway thickening is present, suggesting bronchitis or reactive
airways disease.
2. Mild enlargement of the cardiopericardial silhouette, without
edema.

## 2022-03-26 ENCOUNTER — Ambulatory Visit (INDEPENDENT_AMBULATORY_CARE_PROVIDER_SITE_OTHER): Payer: 59

## 2022-03-26 ENCOUNTER — Ambulatory Visit
Admission: EM | Admit: 2022-03-26 | Discharge: 2022-03-26 | Disposition: A | Discharge status: Home / Self Care | Payer: 59 | Attending: Family Medicine | Admitting: Family Medicine

## 2022-03-26 DIAGNOSIS — M25562 Pain in left knee: Secondary | ICD-10-CM | POA: Diagnosis not present

## 2022-03-26 DIAGNOSIS — W19XXXA Unspecified fall, initial encounter: Secondary | ICD-10-CM | POA: Diagnosis not present

## 2022-03-26 DIAGNOSIS — M545 Low back pain, unspecified: Secondary | ICD-10-CM

## 2022-03-26 MED ORDER — CYCLOBENZAPRINE HCL 10 MG PO TABS
10.0000 mg | ORAL_TABLET | Freq: Three times a day (TID) | ORAL | 0 refills | Status: DC | PRN
Start: 2022-03-26 — End: 2024-07-31

## 2022-03-26 MED ORDER — NAPROXEN 500 MG PO TABS: 500.0000 mg | ORAL_TABLET | Freq: Two times a day (BID) | ORAL | 0 refills | Status: DC | PRN

## 2022-03-30 NOTE — ED Provider Notes (Signed)
[facility]    CSN: [id number] Arrival date & time: 03/26/22  3570      History   Chief Complaint Chief Complaint  Patient presents with   Fall   Knee Injury    HPI Beth Li is a 61 y.o. female.   Presenting today with 1 day history of left anterior knee pain and diffuse low back pain after something and falling after missing a step yesterday.  Denies skin injury, head injury, loss of consciousness, dizziness, loss of range of motion, numbness, tingling, weakness, bowel or bladder incontinence.  So far trying over-the-counter pain relievers with minimal relief.    Past Medical History:  Diagnosis Date   Essential hypertension    GERD (gastroesophageal reflux disease)    Hyperlipidemia    Hypertrophic cardiomyopathy (HCC)    Type 2 diabetes mellitus (HCC)     Patient Active Problem List   Diagnosis Date Noted   Microcytic hypochromic anemia 10/20/2021   Essential hypertension 10/20/2021   OSA (obstructive sleep apnea) 10/20/2021   Diabetes mellitus type 2 in obese (HCC) 10/20/2021    Past Surgical History:  Procedure Laterality Date   BIOPSY  10/22/2021   Procedure: BIOPSY;  Surgeon: Kathi Der, MD;  Location: [facility];  Service: Gastroenterology;;   COLONOSCOPY N/A 10/22/2021   Procedure: COLONOSCOPY;  Surgeon: Kathi Der, MD;  Location: [facility];  Service: Gastroenterology;  Laterality: N/A;   ESOPHAGOGASTRODUODENOSCOPY (EGD) WITH PROPOFOL N/A 10/22/2021   Procedure: ESOPHAGOGASTRODUODENOSCOPY (EGD) WITH PROPOFOL;  Surgeon: Kathi Der, MD;  Location: [facility];  Service: Gastroenterology;  Laterality: N/A;   NO PAST SURGERIES      OB History   No obstetric history on file.      Home Medications    Prior to Admission medications   Medication Sig Start Date End Date Taking? Authorizing Provider  cyclobenzaprine (FLEXERIL) 10 MG tablet Take 1 tablet (10 mg total) by mouth 3 (three) times daily as needed for  muscle spasms. Do not drink alcohol or drive while taking this medication, may cause drowsiness 03/26/22  Yes Particia Nearing, PA-C  naproxen (NAPROSYN) 500 MG tablet Take 1 tablet (500 mg total) by mouth 2 (two) times daily as needed. 03/26/22  Yes Particia Nearing, PA-C  acetaminophen (TYLENOL) 325 MG tablet Take 2 tablets (650 mg total) by mouth every 6 (six) hours as needed for mild pain (or Fever >/= 101). 10/22/21   Lonia Blood, MD  amphetamine-dextroamphetamine (ADDERALL) 20 MG tablet Take 20 mg by mouth daily as needed (focus). 12/13/16   [provider]  aspirin EC 81 MG tablet Take 1 tablet (81 mg total) by mouth daily. 10/29/21   Lonia Blood, MD  atorvastatin (LIPITOR) 40 MG tablet Take 40 mg by mouth daily. 11/30/16   [provider]  Cholecalciferol (VITAMIN D3 PO) Take 1 tablet by mouth daily.    [provider]  DILT-XR 180 MG 24 hr capsule Take 180 mg by mouth 2 (two) times daily. Patient not taking: Reported on 10/20/2021 10/10/21   [provider]  diltiazem (CARDIZEM CD) 240 MG 24 hr capsule Take 1 capsule (240 mg total) by mouth in the morning. 10/04/21   Jonelle Sidle, MD  FARXIGA 10 MG TABS tablet Take 10 mg by mouth daily.  06/23/18   [provider]  ferrous sulfate 325 (65 FE) MG tablet Take 1 tablet (325 mg total) by mouth daily. 10/27/21 10/27/22  Lonia Blood, MD  Magnesium  Gluconate 500 (27 Mg) MG TABS Take 500 mg by mouth daily.    [provider]  metoprolol tartrate (LOPRESSOR) 50 MG tablet Take 1 tablet (50 mg total) by mouth 2 (two) times daily. Patient taking differently: Take 50 mg by mouth at bedtime. 06/27/18 10/20/22  Jonelle Sidle, MD  traZODone (DESYREL) 100 MG tablet Take 100 mg by mouth at bedtime as needed for sleep. 07/09/18   [provider]  zolpidem (AMBIEN) 10 MG tablet Take 10 mg by mouth at bedtime as needed for sleep. 01/04/17   [provider]     Family History Family History  Problem Relation Age of Onset   Heart disease Maternal Grandmother    Congestive Heart Failure Maternal Grandmother    Heart disease Mother    Heart attack Father    Congestive Heart Failure Sister    Heart attack Paternal Grandmother     Social History Social History   Tobacco Use   Smoking status: Never   Smokeless tobacco: Never  Vaping Use   Vaping Use: Never used  Substance Use Topics   Alcohol use: Not Currently    Comment: Occasional   Drug use: No     Allergies   Patient has no known allergies.   Review of Systems Review of Systems Per HPI  Physical Exam Triage Vital Signs ED Triage Vitals  Enc Vitals Group     BP 03/26/22 0821 (!) 162/79     Pulse Rate 03/26/22 0821 78     Resp 03/26/22 0821 20     Temp 03/26/22 0821 98.8 F (37.1 C)     Temp src --      SpO2 03/26/22 0821 98 %     Weight --      Height --      Head Circumference --      Peak Flow --      Pain Score 03/26/22 0820 8     Pain Loc --      Pain Edu? --      Excl. in GC? --    No data found.  Updated Vital Signs BP (!) 162/79   Pulse 78   Temp 98.8 F (37.1 C)   Resp 20   LMP 01/10/2012   SpO2 98%   Visual Acuity Right Eye Distance:   Left Eye Distance:   Bilateral Distance:    Right Eye Near:   Left Eye Near:    Bilateral Near:     Physical Exam Vitals and nursing note reviewed.  Constitutional:      Appearance: Normal appearance. She is not ill-appearing.  HENT:     Head: Atraumatic.     Nose: Nose normal.     Mouth/Throat:     Mouth: Mucous membranes are moist.  Eyes:     Extraocular Movements: Extraocular movements intact.     Conjunctiva/sclera: Conjunctivae normal.  Cardiovascular:     Rate and Rhythm: Normal rate and regular rhythm.     Heart sounds: Normal heart sounds.  Pulmonary:     Effort: Pulmonary effort is normal.     Breath sounds: Normal breath sounds. No wheezing or rales.  Musculoskeletal:         General: Swelling, tenderness and signs of injury present. Normal range of motion.     Cervical back: Normal range of motion and neck supple.     Comments: Trace edema left knee diffusely, anterior tenderness to palpation diffusely.  No joint instability, negative drawer  testing or McMurray's testing.  Range of motion intact.  Mild diffuse lumbar tenderness to palpation with no midline spinal tenderness to palpation diffusely.  Negative straight leg raise bilaterally.  Skin:    General: Skin is warm and dry.     Findings: No bruising or erythema.  Neurological:     Mental Status: She is alert and oriented to person, place, and time.     Motor: No weakness.     Gait: Gait normal.     Comments: Left lower extremity neurovascularly intact  Psychiatric:        Mood and Affect: Mood normal.        Thought Content: Thought content normal.        Judgment: Judgment normal.    UC Treatments / Results  Labs (all labs ordered are listed, but only abnormal results are displayed) Labs Reviewed - No data to display  EKG   Radiology No results found.  Procedures Procedures (including critical care time)  Medications Ordered in UC Medications - No data to display  Initial Impression / Assessment and Plan / UC Course  I have reviewed the triage vital signs and the nursing notes.  Pertinent labs & imaging results that were available during my care of the patient were reviewed by me and considered in my medical decision making (see chart for details).     X-ray of the left knee negative for acute bony abnormality, chronic arthritic changes present.  Suspect contusion to the knee and lumbar strain.  Treat with Flexeril, naproxen, RICE protocol, over-the-counter Tylenol.  Return for worsening symptoms.  Final Clinical Impressions(s) / UC Diagnoses   Final diagnoses:  Acute pain of left knee  Acute bilateral low back pain without sciatica  Fall, initial encounter   Discharge Instructions    None    ED Prescriptions     Medication Sig Dispense Auth. Provider   naproxen (NAPROSYN) 500 MG tablet Take 1 tablet (500 mg total) by mouth 2 (two) times daily as needed. 30 tablet Particia Nearing, [place]   cyclobenzaprine (FLEXERIL) 10 MG tablet Take 1 tablet (10 mg total) by mouth 3 (three) times daily as needed for muscle spasms. Do not drink alcohol or drive while taking this medication, may cause drowsiness 15 tablet Particia Nearing, [place]      PDMP not reviewed this encounter.   Roosvelt Maser [place], [place] 03/30/22 [phone]

## 2022-06-22 ENCOUNTER — Encounter: Payer: Self-pay | Admitting: Emergency Medicine

## 2022-06-22 ENCOUNTER — Ambulatory Visit
Admission: EM | Admit: 2022-06-22 | Discharge: 2022-06-22 | Disposition: A | Discharge status: Home / Self Care | Payer: 59 | Attending: Family Medicine | Admitting: Family Medicine

## 2022-06-22 ENCOUNTER — Other Ambulatory Visit: Payer: Self-pay | Admitting: Family Medicine

## 2022-06-22 DIAGNOSIS — J209 Acute bronchitis, unspecified: Secondary | ICD-10-CM

## 2022-06-22 DIAGNOSIS — Z20822 Contact with and (suspected) exposure to covid-19: Secondary | ICD-10-CM | POA: Diagnosis not present

## 2022-06-22 DIAGNOSIS — J069 Acute upper respiratory infection, unspecified: Secondary | ICD-10-CM | POA: Insufficient documentation

## 2022-06-22 LAB — RESP PANEL BY RT-PCR (FLU A&B, COVID) ARPGX2
Influenza A by PCR: NEGATIVE
Influenza B by PCR: NEGATIVE
SARS Coronavirus 2 by RT PCR: NEGATIVE

## 2022-06-22 MED ORDER — ALBUTEROL SULFATE HFA 108 (90 BASE) MCG/ACT IN AERS: 2.0000 | INHALATION_SPRAY | RESPIRATORY_TRACT | 0 refills | Status: DC | PRN

## 2022-06-22 MED ORDER — PROMETHAZINE-DM 6.25-15 MG/5ML PO SYRP: 5.0000 mL | ORAL_SOLUTION | Freq: Four times a day (QID) | ORAL | 0 refills | Status: DC | PRN

## 2022-06-22 MED ORDER — MOLNUPIRAVIR EUA 200MG CAPSULE: 4.0000 | ORAL_CAPSULE | Freq: Two times a day (BID) | ORAL | 0 refills | Status: AC

## 2022-06-22 NOTE — ED Provider Notes (Signed)
[facility]    CSN: [id number] Arrival date & time: 06/22/22  0957      History   Chief Complaint Chief Complaint  Patient presents with   Cough    HPI Beth Li is a 61 y.o. female.   Patient presenting today with 4-day history of body aches, fever, chills, cough, congestion, sore throat since Tuesday.  States she gets into severe coughing fits anytime she tries to take a deep breath.  Denies chest pain, shortness of breath, abdominal pain, nausea vomiting or diarrhea.  So far trying numerous over-the-counter cold and congestion medications with minimal relief.  COVID test day 1 of symptoms at home was negative.  No known sick contacts recently.  No known pertinent chronic medical problems per patient.    Past Medical History:  Diagnosis Date   Essential hypertension    GERD (gastroesophageal reflux disease)    Hyperlipidemia    Hypertrophic cardiomyopathy (HCC)    Type 2 diabetes mellitus ([place])     Patient Active Problem List   Diagnosis Date Noted   Microcytic hypochromic anemia 10/20/2021   Essential hypertension 10/20/2021   OSA (obstructive sleep apnea) 10/20/2021   Diabetes mellitus type 2 in obese ([place]) 10/20/2021    Past Surgical History:  Procedure Laterality Date   BIOPSY  10/22/2021   Procedure: BIOPSY;  Surgeon: Otis Brace, MD;  Location: [facility];  Service: Gastroenterology;;   COLONOSCOPY N/A 10/22/2021   Procedure: COLONOSCOPY;  Surgeon: Otis Brace, MD;  Location: [facility];  Service: Gastroenterology;  Laterality: N/A;   ESOPHAGOGASTRODUODENOSCOPY (EGD) WITH PROPOFOL N/A 10/22/2021   Procedure: ESOPHAGOGASTRODUODENOSCOPY (EGD) WITH PROPOFOL;  Surgeon: Otis Brace, MD;  Location: [facility];  Service: Gastroenterology;  Laterality: N/A;   NO PAST SURGERIES      OB History   No obstetric history on file.      Home Medications    Prior to Admission medications   Medication Sig Start Date End Date  Taking? Authorizing Provider  albuterol (VENTOLIN HFA) 108 (90 Base) MCG/ACT inhaler Inhale 2 puffs into the lungs every 4 (four) hours as needed for wheezing or shortness of breath. 06/22/22  Yes Volney American, PA-C  molnupiravir EUA (LAGEVRIO) 200 mg CAPS capsule Take 4 capsules (800 mg total) by mouth 2 (two) times daily for 5 days. 06/22/22 06/27/22 Yes Volney American, PA-C  promethazine-dextromethorphan (PROMETHAZINE-DM) 6.25-15 MG/5ML syrup Take 5 mLs by mouth 4 (four) times daily as needed. 06/22/22  Yes Volney American, PA-C  acetaminophen (TYLENOL) 325 MG tablet Take 2 tablets (650 mg total) by mouth every 6 (six) hours as needed for mild pain (or Fever >/= 101). 10/22/21   Cherene Altes, MD  amphetamine-dextroamphetamine (ADDERALL) 20 MG tablet Take 20 mg by mouth daily as needed (focus). 12/13/16   [provider]  aspirin EC 81 MG tablet Take 1 tablet (81 mg total) by mouth daily. 10/29/21   Cherene Altes, MD  atorvastatin (LIPITOR) 40 MG tablet Take 40 mg by mouth daily. 11/30/16   [provider]  Cholecalciferol (VITAMIN D3 PO) Take 1 tablet by mouth daily.    [provider]  cyclobenzaprine (FLEXERIL) 10 MG tablet Take 1 tablet (10 mg total) by mouth 3 (three) times daily as needed for muscle spasms. Do not drink alcohol or drive while taking this medication, may cause drowsiness 03/26/22   Volney American, PA-C  DILT-XR 180 MG 24 hr capsule Take 180 mg by mouth 2 (two) times  daily. Patient not taking: Reported on 10/20/2021 10/10/21   [provider]  diltiazem (CARDIZEM CD) 240 MG 24 hr capsule Take 1 capsule (240 mg total) by mouth in the morning. 10/04/21   Jonelle SidleMcDowell, Samuel G, MD  FARXIGA 10 MG TABS tablet Take 10 mg by mouth daily.  06/23/18   [provider]  ferrous sulfate 325 (65 FE) MG tablet Take 1 tablet (325 mg total) by mouth daily. 10/27/21 10/27/22  Lonia BloodMcClung, Jeffrey T, MD  Magnesium Gluconate 500 (27  Mg) MG TABS Take 500 mg by mouth daily.    [provider]  metoprolol tartrate (LOPRESSOR) 50 MG tablet Take 1 tablet (50 mg total) by mouth 2 (two) times daily. Patient taking differently: Take 50 mg by mouth at bedtime. 06/27/18 10/20/22  Jonelle SidleMcDowell, Samuel G, MD  naproxen (NAPROSYN) 500 MG tablet Take 1 tablet (500 mg total) by mouth 2 (two) times daily as needed. 03/26/22   Particia NearingLane, Amay Mijangos Elizabeth, PA-C  traZODone (DESYREL) 100 MG tablet Take 100 mg by mouth at bedtime as needed for sleep. 07/09/18   [provider]  zolpidem (AMBIEN) 10 MG tablet Take 10 mg by mouth at bedtime as needed for sleep. 01/04/17   [provider]    Family History Family History  Problem Relation Age of Onset   Heart disease Maternal Grandmother    Congestive Heart Failure Maternal Grandmother    Heart disease Mother    Heart attack Father    Congestive Heart Failure Sister    Heart attack Paternal Grandmother     Social History Social History   Tobacco Use   Smoking status: Never   Smokeless tobacco: Never  Vaping Use   Vaping Use: Never used  Substance Use Topics   Alcohol use: Not Currently    Comment: Occasional   Drug use: No     Allergies   Patient has no known allergies.   Review of Systems Review of Systems Per HPI  Physical Exam Triage Vital Signs ED Triage Vitals  Enc Vitals Group     BP 06/22/22 1046 (!) 155/80     Pulse Rate 06/22/22 1046 94     Resp 06/22/22 1046 18     Temp 06/22/22 1046 98.4 F (36.9 C)     Temp Source 06/22/22 1046 Oral     SpO2 06/22/22 1046 98 %     Weight --      Height --      Head Circumference --      Peak Flow --      Pain Score 06/22/22 1047 8     Pain Loc --      Pain Edu? --      Excl. in [place]? --    No data found.  Updated Vital Signs BP (!) 155/80 (BP Location: Right Arm)   Pulse 94   Temp 98.4 F (36.9 C) (Oral)   Resp 18   LMP 01/10/2012   SpO2 98%   Visual Acuity Right Eye Distance:   Left Eye  Distance:   Bilateral Distance:    Right Eye Near:   Left Eye Near:    Bilateral Near:     Physical Exam Vitals and nursing note reviewed.  Constitutional:      Appearance: Normal appearance.  HENT:     Head: Atraumatic.     Right Ear: Tympanic membrane and external ear normal.     Left Ear: Tympanic membrane and external ear normal.  Nose: Rhinorrhea present.     Mouth/Throat:     Mouth: Mucous membranes are moist.     Pharynx: Posterior oropharyngeal erythema present.  Eyes:     Extraocular Movements: Extraocular movements intact.     Conjunctiva/sclera: Conjunctivae normal.  Cardiovascular:     Rate and Rhythm: Normal rate and regular rhythm.     Heart sounds: Normal heart sounds.  Pulmonary:     Effort: Pulmonary effort is normal.     Breath sounds: Normal breath sounds. No wheezing or rales.     Comments: Unable to take a full deep breath without going into a coughing fit though no wheezes appreciable Musculoskeletal:        General: Normal range of motion.     Cervical back: Normal range of motion and neck supple.  Skin:    General: Skin is warm and dry.  Neurological:     Mental Status: She is alert and oriented to person, place, and time.  Psychiatric:        Mood and Affect: Mood normal.        Thought Content: Thought content normal.      UC Treatments / Results  Labs (all labs ordered are listed, but only abnormal results are displayed) Labs Reviewed  RESP PANEL BY RT-PCR (FLU A&B, COVID) ARPGX2    EKG   Radiology No results found.  Procedures Procedures (including critical care time)  Medications Ordered in UC Medications - No data to display  Initial Impression / Assessment and Plan / UC Course  I have reviewed the triage vital signs and the nursing notes.  Pertinent labs & imaging results that were available during my care of the patient were reviewed by me and considered in my medical decision making (see chart for details).      Suspect viral URI causing some secondary bronchitis.  Respiratory panel pending, strong suspicion for COVID-19 so we will start molnupiravir while awaiting these results as she is on the cusp of being outside of the window for initiation as well as treat for her coughing issue with albuterol, Phenergan DM.  Discussed supportive over-the-counter medications and home care, return precautions.    Final Clinical Impressions(s) / UC Diagnoses   Final diagnoses:  Viral URI with cough  Acute bronchitis, unspecified organism     Discharge Instructions      Today we have tested you for COVID and flu.  These results should be available through MyChart by tomorrow and someone should get in touch with you if anything is positive.  We will go ahead and start the antiviral for COVID called molnupiravir as I suspect this may be what is causing her symptoms.  You may stop it if your COVID test is negative tomorrow.  I have also sent an albuterol inhaler and a good cough syrup to help with your bronchitis symptoms secondary to what ever viral infection is occurring.  You may continue to take over-the-counter cold and congestion medications, pain and fever reducers and drink plenty of fluids, get lots of rest.  Return for any worsening symptoms    ED Prescriptions     Medication Sig Dispense Auth. Provider   albuterol (VENTOLIN HFA) 108 (90 Base) MCG/ACT inhaler Inhale 2 puffs into the lungs every 4 (four) hours as needed for wheezing or shortness of breath. 18 g Roosvelt Maser [place], [place]   promethazine-dextromethorphan (PROMETHAZINE-DM) 6.25-15 MG/5ML syrup Take 5 mLs by mouth 4 (four) times daily as needed. 100 mL Roosvelt Maser  Elizabeth, PA-C   molnupiravir EUA (LAGEVRIO) 200 mg CAPS capsule Take 4 capsules (800 mg total) by mouth 2 (two) times daily for 5 days. 40 capsule Particia Nearing, [place]      PDMP not reviewed this encounter.   Particia Nearing, [place] 06/22/22 1144

## 2022-06-22 NOTE — Discharge Instructions (Signed)
Today we have tested you for COVID and flu.  These results should be available through MyChart by tomorrow and someone should get in touch with you if anything is positive.  We will go ahead and start the antiviral for COVID called molnupiravir as I suspect this may be what is causing her symptoms.  You may stop it if your COVID test is negative tomorrow.  I have also sent an albuterol inhaler and a good cough syrup to help with your bronchitis symptoms secondary to what ever viral infection is occurring.  You may continue to take over-the-counter cold and congestion medications, pain and fever reducers and drink plenty of fluids, get lots of rest.  Return for any worsening symptoms

## 2022-06-22 NOTE — ED Triage Notes (Signed)
Body aches, cough, nasal congestion, sore throat since Tuesday.  At home covid test on Tuesday was negative.

## 2022-07-19 ENCOUNTER — Other Ambulatory Visit: Payer: Self-pay | Admitting: Family Medicine

## 2022-08-03 ENCOUNTER — Ambulatory Visit
Admission: EM | Admit: 2022-08-03 | Discharge: 2022-08-03 | Disposition: A | Discharge status: Home / Self Care | Payer: 59 | Attending: Family Medicine | Admitting: Family Medicine

## 2022-08-03 ENCOUNTER — Encounter: Payer: Self-pay | Admitting: Emergency Medicine

## 2022-08-03 DIAGNOSIS — R062 Wheezing: Secondary | ICD-10-CM

## 2022-08-03 DIAGNOSIS — Z8709 Personal history of other diseases of the respiratory system: Secondary | ICD-10-CM | POA: Diagnosis not present

## 2022-08-03 DIAGNOSIS — J069 Acute upper respiratory infection, unspecified: Secondary | ICD-10-CM

## 2022-08-03 DIAGNOSIS — U071 COVID-19: Secondary | ICD-10-CM | POA: Insufficient documentation

## 2022-08-03 LAB — RESP PANEL BY RT-PCR (FLU A&B, COVID) ARPGX2
Influenza A by PCR: NEGATIVE
Influenza B by PCR: NEGATIVE
SARS Coronavirus 2 by RT PCR: POSITIVE — AB

## 2022-08-03 MED ORDER — ALBUTEROL SULFATE HFA 108 (90 BASE) MCG/ACT IN AERS: 2.0000 | INHALATION_SPRAY | RESPIRATORY_TRACT | 0 refills | Status: DC | PRN

## 2022-08-03 MED ORDER — METHYLPREDNISOLONE SODIUM SUCC 125 MG IJ SOLR
60.0000 mg | Freq: Once | INTRAMUSCULAR | Status: AC
  Administered 2022-08-03: 60 mg via INTRAMUSCULAR

## 2022-08-03 MED ORDER — PROMETHAZINE-DM 6.25-15 MG/5ML PO SYRP: 5.0000 mL | ORAL_SOLUTION | Freq: Four times a day (QID) | ORAL | 0 refills | Status: DC | PRN

## 2022-08-03 NOTE — ED Triage Notes (Signed)
Cough, head congestion, sore throat, and body aches since Wednesday.  Has been taking sudafed and nyquil without relief.

## 2022-08-03 NOTE — Discharge Instructions (Signed)
We have tested you today for COVID And flu and should have those results back in the morning.  Someone will call you if either are positive and discuss antiviral therapy with you.  In the meantime, we have given you a steroid shot to help with the chest tightness and albuterol, cough syrup.  You may continue to take over-the-counter cold and congestion medications, pain relievers as needed.

## 2022-08-03 NOTE — ED Provider Notes (Signed)
[facility]    CSN: [id number] Arrival date & time: 08/03/22  1029      History   Chief Complaint No chief complaint on file.   HPI Beth Li is a 61 y.o. female.   Patient presenting today with 2-day history of productive cough, nasal congestion, sinus pain and pressure, sore throat, body aches, chest tightness, occasional wheezing.  Denies known fever, chills, abdominal pain, nausea vomiting or diarrhea.  Trying Sudafed and NyQuil with minimal relief of symptoms.  History of bronchitis but no chronic pulmonary disease diagnosed.  No known sick contacts recently.    Past Medical History:  Diagnosis Date   Essential hypertension    GERD (gastroesophageal reflux disease)    Hyperlipidemia    Hypertrophic cardiomyopathy (HCC)    Type 2 diabetes mellitus (HCC)     Patient Active Problem List   Diagnosis Date Noted   Microcytic hypochromic anemia 10/20/2021   Essential hypertension 10/20/2021   OSA (obstructive sleep apnea) 10/20/2021   Diabetes mellitus type 2 in obese (HCC) 10/20/2021    Past Surgical History:  Procedure Laterality Date   BIOPSY  10/22/2021   Procedure: BIOPSY;  Surgeon: Kathi Der, MD;  Location: [facility];  Service: Gastroenterology;;   COLONOSCOPY N/A 10/22/2021   Procedure: COLONOSCOPY;  Surgeon: Kathi Der, MD;  Location: [facility];  Service: Gastroenterology;  Laterality: N/A;   ESOPHAGOGASTRODUODENOSCOPY (EGD) WITH PROPOFOL N/A 10/22/2021   Procedure: ESOPHAGOGASTRODUODENOSCOPY (EGD) WITH PROPOFOL;  Surgeon: Kathi Der, MD;  Location: [facility];  Service: Gastroenterology;  Laterality: N/A;   NO PAST SURGERIES      OB History   No obstetric history on file.      Home Medications    Prior to Admission medications   Medication Sig Start Date End Date Taking? Authorizing Provider  albuterol (VENTOLIN HFA) 108 (90 Base) MCG/ACT inhaler Inhale 2 puffs into the lungs every 4 (four) hours as  needed for wheezing or shortness of breath. 08/03/22  Yes Particia Nearing, PA-C  acetaminophen (TYLENOL) 325 MG tablet Take 2 tablets (650 mg total) by mouth every 6 (six) hours as needed for mild pain (or Fever >/= 101). 10/22/21   Lonia Blood, MD  amphetamine-dextroamphetamine (ADDERALL) 20 MG tablet Take 20 mg by mouth daily as needed (focus). 12/13/16   [provider]  aspirin EC 81 MG tablet Take 1 tablet (81 mg total) by mouth daily. 10/29/21   Lonia Blood, MD  atorvastatin (LIPITOR) 40 MG tablet Take 40 mg by mouth daily. 11/30/16   [provider]  Cholecalciferol (VITAMIN D3 PO) Take 1 tablet by mouth daily.    [provider]  cyclobenzaprine (FLEXERIL) 10 MG tablet Take 1 tablet (10 mg total) by mouth 3 (three) times daily as needed for muscle spasms. Do not drink alcohol or drive while taking this medication, may cause drowsiness 03/26/22   Particia Nearing, PA-C  DILT-XR 180 MG 24 hr capsule Take 180 mg by mouth 2 (two) times daily. Patient not taking: Reported on 10/20/2021 10/10/21   [provider]  diltiazem (CARDIZEM CD) 240 MG 24 hr capsule Take 1 capsule (240 mg total) by mouth in the morning. 10/04/21   Jonelle Sidle, MD  FARXIGA 10 MG TABS tablet Take 10 mg by mouth daily.  06/23/18   [provider]  ferrous sulfate 325 (65 FE) MG tablet Take 1 tablet (325 mg total) by mouth daily. 10/27/21 10/27/22  Lonia Blood, MD  levalbuterol (XOPENEX HFA) 45 MCG/ACT inhaler Inhale 2 puffs into the lungs every 6 (six) hours as needed for wheezing. 06/22/22   Volney American, PA-C  Magnesium Gluconate 500 (27 Mg) MG TABS Take 500 mg by mouth daily.    [provider]  metoprolol tartrate (LOPRESSOR) 50 MG tablet Take 1 tablet (50 mg total) by mouth 2 (two) times daily. Patient taking differently: Take 50 mg by mouth at bedtime. 06/27/18 10/20/22  Satira Sark, MD  naproxen (NAPROSYN) 500 MG tablet Take  1 tablet (500 mg total) by mouth 2 (two) times daily as needed. 03/26/22   Volney American, PA-C  promethazine-dextromethorphan (PROMETHAZINE-DM) 6.25-15 MG/5ML syrup Take 5 mLs by mouth 4 (four) times daily as needed. 08/03/22   Volney American, PA-C  traZODone (DESYREL) 100 MG tablet Take 100 mg by mouth at bedtime as needed for sleep. 07/09/18   [provider]  zolpidem (AMBIEN) 10 MG tablet Take 10 mg by mouth at bedtime as needed for sleep. 01/04/17   [provider]    Family History Family History  Problem Relation Age of Onset   Heart disease Maternal Grandmother    Congestive Heart Failure Maternal Grandmother    Heart disease Mother    Heart attack Father    Congestive Heart Failure Sister    Heart attack Paternal Grandmother     Social History Social History   Tobacco Use   Smoking status: Never   Smokeless tobacco: Never  Vaping Use   Vaping Use: Never used  Substance Use Topics   Alcohol use: Not Currently    Comment: Occasional   Drug use: No     Allergies   Patient has no known allergies.   Review of Systems Review of Systems Per HPI  Physical Exam Triage Vital Signs ED Triage Vitals  Enc Vitals Group     BP 08/03/22 1149 132/78     Pulse Rate 08/03/22 1149 97     Resp 08/03/22 1149 18     Temp 08/03/22 1149 98.5 F (36.9 C)     Temp Source 08/03/22 1149 Oral     SpO2 08/03/22 1149 98 %     Weight --      Height --      Head Circumference --      Peak Flow --      Pain Score 08/03/22 1150 8     Pain Loc --      Pain Edu? --      Excl. in [place]? --    No data found.  Updated Vital Signs BP 132/78 (BP Location: Right Arm)   Pulse 97   Temp 98.5 F (36.9 C) (Oral)   Resp 18   LMP 01/10/2012   SpO2 98%   Visual Acuity Right Eye Distance:   Left Eye Distance:   Bilateral Distance:    Right Eye Near:   Left Eye Near:    Bilateral Near:     Physical Exam Vitals and nursing note reviewed.   Constitutional:      Appearance: Normal appearance.  HENT:     Head: Atraumatic.     Right Ear: Tympanic membrane and external ear normal.     Left Ear: Tympanic membrane and external ear normal.     Nose: Congestion present.     Mouth/Throat:     Mouth: Mucous membranes are moist.     Pharynx: Posterior oropharyngeal erythema present.  Eyes:     Extraocular  Movements: Extraocular movements intact.     Conjunctiva/sclera: Conjunctivae normal.  Cardiovascular:     Rate and Rhythm: Normal rate and regular rhythm.     Heart sounds: Normal heart sounds.  Pulmonary:     Effort: Pulmonary effort is normal.     Breath sounds: Wheezing present. No rales.     Comments: Trace wheezes bilaterally Musculoskeletal:        General: Normal range of motion.     Cervical back: Normal range of motion and neck supple.  Skin:    General: Skin is warm and dry.  Neurological:     Mental Status: She is alert and oriented to person, place, and time.  Psychiatric:        Mood and Affect: Mood normal.        Thought Content: Thought content normal.      UC Treatments / Results  Labs (all labs ordered are listed, but only abnormal results are displayed) Labs Reviewed  RESP PANEL BY RT-PCR (FLU A&B, COVID) ARPGX2    EKG   Radiology No results found.  Procedures Procedures (including critical care time)  Medications Ordered in UC Medications  methylPREDNISolone sodium succinate (SOLU-MEDROL) 125 mg/2 mL injection 60 mg (60 mg Intramuscular Given 08/03/22 1216)    Initial Impression / Assessment and Plan / UC Course  I have reviewed the triage vital signs and the nursing notes.  Pertinent labs & imaging results that were available during my care of the patient were reviewed by me and considered in my medical decision making (see chart for details).     Vital signs reassuring today, exam consistent with viral upper respiratory infection leading to some bronchitis.  We will treat with  IM Solu-Medrol, albuterol, Phenergan DM and discussed supportive over-the-counter medications and home care while awaiting respiratory panel.  Good candidate for antiviral therapy if positive.  Return for worsening symptoms.  Final Clinical Impressions(s) / UC Diagnoses   Final diagnoses:  Viral URI with cough  Wheezing     Discharge Instructions      We have tested you today for COVID And flu and should have those results back in the morning.  Someone will call you if either are positive and discuss antiviral therapy with you.  In the meantime, we have given you a steroid shot to help with the chest tightness and albuterol, cough syrup.  You may continue to take over-the-counter cold and congestion medications, pain relievers as needed.    ED Prescriptions     Medication Sig Dispense Auth. Provider   albuterol (VENTOLIN HFA) 108 (90 Base) MCG/ACT inhaler Inhale 2 puffs into the lungs every 4 (four) hours as needed for wheezing or shortness of breath. 18 g Roosvelt Maser [place], [place]   promethazine-dextromethorphan (PROMETHAZINE-DM) 6.25-15 MG/5ML syrup Take 5 mLs by mouth 4 (four) times daily as needed. 100 mL Particia Nearing, [place]      PDMP not reviewed this encounter.   Particia Nearing, [place] 08/03/22 1732

## 2022-08-04 ENCOUNTER — Telehealth: Payer: Self-pay | Admitting: Emergency Medicine

## 2022-08-04 ENCOUNTER — Telehealth: Payer: Self-pay

## 2022-08-04 MED ORDER — MOLNUPIRAVIR EUA 200MG CAPSULE: 4.0000 | ORAL_CAPSULE | Freq: Two times a day (BID) | ORAL | 0 refills | Status: AC

## 2022-08-04 NOTE — Telephone Encounter (Signed)
Pt called and inquired about antiviral prescription due to covid result being positive. Consulted PA and electronically sent in antiviral prescription to pharmacy on file. Attempted to call pt x1 to make aware of prescription being sent, no answer at number provided.

## 2022-08-04 NOTE — Telephone Encounter (Signed)
Pt called back and confirmed pharmacy and made aware prescription electronically sent over.

## 2022-09-17 ENCOUNTER — Ambulatory Visit: Admission: EM | Admit: 2022-09-17 | Discharge: 2022-09-17 | Discharge status: Against Medical Advice | Payer: 59

## 2022-09-17 NOTE — ED Notes (Signed)
Called patient in lobby, and then on cell phone for patients turn, patient stated she had left and will try to come back in the morning.

## 2024-01-15 ENCOUNTER — Encounter: Payer: Self-pay | Admitting: Cardiology

## 2024-01-15 ENCOUNTER — Ambulatory Visit: Payer: 59 | Attending: Cardiology | Admitting: Cardiology

## 2024-05-18 ENCOUNTER — Ambulatory Visit: Attending: Cardiology | Admitting: Cardiology

## 2024-06-21 ENCOUNTER — Emergency Department (HOSPITAL_COMMUNITY): Admission: EM | Admit: 2024-06-21 | Discharge: 2024-06-21 | Disposition: A | Discharge status: Home / Self Care

## 2024-06-21 ENCOUNTER — Emergency Department (HOSPITAL_COMMUNITY)

## 2024-06-21 ENCOUNTER — Other Ambulatory Visit: Payer: Self-pay

## 2024-06-21 DIAGNOSIS — Z8241 Family history of sudden cardiac death: Secondary | ICD-10-CM | POA: Diagnosis not present

## 2024-06-21 DIAGNOSIS — E119 Type 2 diabetes mellitus without complications: Secondary | ICD-10-CM | POA: Insufficient documentation

## 2024-06-21 DIAGNOSIS — R0602 Shortness of breath: Secondary | ICD-10-CM

## 2024-06-21 DIAGNOSIS — D509 Iron deficiency anemia, unspecified: Secondary | ICD-10-CM | POA: Diagnosis not present

## 2024-06-21 DIAGNOSIS — I422 Other hypertrophic cardiomyopathy: Secondary | ICD-10-CM | POA: Insufficient documentation

## 2024-06-21 DIAGNOSIS — Z79899 Other long term (current) drug therapy: Secondary | ICD-10-CM | POA: Insufficient documentation

## 2024-06-21 DIAGNOSIS — R000 Tachycardia, unspecified: Secondary | ICD-10-CM

## 2024-06-21 DIAGNOSIS — D72829 Elevated white blood cell count, unspecified: Secondary | ICD-10-CM | POA: Diagnosis not present

## 2024-06-21 DIAGNOSIS — E6609 Other obesity due to excess calories: Secondary | ICD-10-CM | POA: Diagnosis not present

## 2024-06-21 DIAGNOSIS — Z6830 Body mass index (BMI) 30.0-30.9, adult: Secondary | ICD-10-CM

## 2024-06-21 DIAGNOSIS — I10 Essential (primary) hypertension: Secondary | ICD-10-CM | POA: Diagnosis not present

## 2024-06-21 DIAGNOSIS — R7989 Other specified abnormal findings of blood chemistry: Secondary | ICD-10-CM | POA: Diagnosis not present

## 2024-06-21 LAB — COMPREHENSIVE METABOLIC PANEL WITH GFR
ALT: 16 U/L (ref 0–44)
AST: 27 U/L (ref 15–41)
Albumin: 4.3 g/dL (ref 3.5–5.0)
Alkaline Phosphatase: 127 U/L — ABNORMAL HIGH (ref 38–126)
Anion gap: 14 (ref 5–15)
BUN: 13 mg/dL (ref 8–23)
CO2: 24 mmol/L (ref 22–32)
Calcium: 9.7 mg/dL (ref 8.9–10.3)
Chloride: 98 mmol/L (ref 98–111)
Creatinine, Ser: 0.88 mg/dL (ref 0.44–1.00)
GFR, Estimated: >60 mL/min (ref >60)
Glucose, Bld: 169 mg/dL — ABNORMAL HIGH (ref 70–99)
Potassium: 4.3 mmol/L (ref 3.5–5.1)
Sodium: 135 mmol/L (ref 135–145)
Total Bilirubin: 0.5 mg/dL (ref 0.0–1.2)
Total Protein: 7.7 g/dL (ref 6.5–8.1)

## 2024-06-21 LAB — MAGNESIUM: Magnesium: 2.2 mg/dL (ref 1.7–2.4)

## 2024-06-21 LAB — TROPONIN T, HIGH SENSITIVITY: Troponin T High Sensitivity: <15 ng/L (ref 0–19)

## 2024-06-21 LAB — RESP PANEL BY RT-PCR (RSV, FLU A&B, COVID)  RVPGX2
Influenza A by PCR: NEGATIVE
Influenza B by PCR: NEGATIVE
Resp Syncytial Virus by PCR: NEGATIVE
SARS Coronavirus 2 by RT PCR: NEGATIVE

## 2024-06-21 LAB — PRO BRAIN NATRIURETIC PEPTIDE: Pro Brain Natriuretic Peptide: 732 pg/mL — ABNORMAL HIGH (ref <300.0)

## 2024-06-21 LAB — TSH: TSH: 1.06 u[IU]/mL (ref 0.350–4.500)

## 2024-06-21 LAB — CBC
HCT: 41.9 % (ref 36.0–46.0)
Hemoglobin: 12.7 g/dL (ref 12.0–15.0)
MCH: 26.2 pg (ref 26.0–34.0)
MCHC: 30.3 g/dL (ref 30.0–36.0)
MCV: 86.6 fL (ref 80.0–100.0)
Platelets: 257 K/uL (ref 150–400)
RBC: 4.84 MIL/uL (ref 3.87–5.11)
RDW: 15.2 % (ref 11.5–15.5)
WBC: 11.4 K/uL — ABNORMAL HIGH (ref 4.0–10.5)
nRBC: 0 % (ref 0.0–0.2)

## 2024-06-21 LAB — D-DIMER, QUANTITATIVE: D-Dimer, Quant: <0.27 ug{FEU}/mL (ref 0.00–0.50)

## 2024-06-21 MED ORDER — DILTIAZEM HCL ER COATED BEADS 360 MG PO CP24: 360.0000 mg | ORAL_CAPSULE | Freq: Every day | ORAL | 0 refills | Status: DC

## 2024-06-21 MED ORDER — METOPROLOL SUCCINATE ER 100 MG PO TB24: 150.0000 mg | ORAL_TABLET | Freq: Every day | ORAL | 0 refills | Status: DC

## 2024-06-21 MED ORDER — LACTATED RINGERS IV BOLUS
1000.0000 mL | Freq: Once | INTRAVENOUS | Status: AC
  Administered 2024-06-21: 1000 mL via INTRAVENOUS

## 2024-06-21 NOTE — ED Triage Notes (Signed)
 Arrived POV from home, patient reports SHOB that started on Tuesday 9/16 and chest discomfort 5/10 and headache 5/10 that started this morning. Patient ambulated to room from waiting room NAD.

## 2024-06-21 NOTE — ED Provider Notes (Addendum)
 [place] EMERGENCY DEPARTMENT AT [facility] Provider Note   CSN: [id number] Arrival date & time: 06/21/24  1145     Patient presents with: Shortness of Breath and Chest Pain   Beth Li is a 63 y.o. female.   63 year old female presenting emergency department for shortness of breath.  Worsened over the past 2 weeks.  Some intermittent lightheadedness.  Also noting elevated heart rate.  Does take diltiazem  and metoprolol .  No chest pain.  No lower extremity edema.  Low risk for PE based on Wells criteria.  Not feeling particularly short of breath sitting in bed.   Shortness of Breath Associated symptoms: chest pain   Chest Pain Associated symptoms: shortness of breath        Prior to Admission medications   Medication Sig Start Date End Date Taking? Authorizing Provider  acetaminophen  (TYLENOL ) 325 MG tablet Take 2 tablets (650 mg total) by mouth every 6 (six) hours as needed for mild pain (or Fever >/= 101). 10/22/21   Danton Reyes DASEN, MD  albuterol  (VENTOLIN  HFA) 108 (90 Base) MCG/ACT inhaler Inhale 2 puffs into the lungs every 4 (four) hours as needed for wheezing or shortness of breath. 08/03/22   Stuart Vernell Norris, PA-C  amphetamine-dextroamphetamine (ADDERALL) 20 MG tablet Take 20 mg by mouth daily as needed (focus). 12/13/16   [provider]  aspirin  EC 81 MG tablet Take 1 tablet (81 mg total) by mouth daily. 10/29/21   Danton Reyes DASEN, MD  atorvastatin  (LIPITOR) 40 MG tablet Take 40 mg by mouth daily. 11/30/16   [provider]  Cholecalciferol (VITAMIN D3 PO) Take 1 tablet by mouth daily.    [provider]  cyclobenzaprine  (FLEXERIL ) 10 MG tablet Take 1 tablet (10 mg total) by mouth 3 (three) times daily as needed for muscle spasms. Do not drink alcohol or drive while taking this medication, may cause drowsiness 03/26/22   Stuart Vernell Norris, PA-C  DILT-XR 180 MG 24 hr capsule Take 180 mg by mouth 2 (two) times  daily. Patient not taking: Reported on 10/20/2021 10/10/21   [provider]  diltiazem  (CARDIZEM  CD) 240 MG 24 hr capsule Take 1 capsule (240 mg total) by mouth in the morning. 10/04/21   Debera Jayson MATSU, MD  FARXIGA 10 MG TABS tablet Take 10 mg by mouth daily.  06/23/18   [provider]  ferrous sulfate  325 (65 FE) MG tablet Take 1 tablet (325 mg total) by mouth daily. 10/27/21 10/27/22  Danton Reyes DASEN, MD  levalbuterol [facility] HFA) 45 MCG/ACT inhaler Inhale 2 puffs into the lungs every 6 (six) hours as needed for wheezing. 06/22/22   Stuart Vernell Norris, PA-C  Magnesium  Gluconate 500 (27 Mg) MG TABS Take 500 mg by mouth daily.    [provider]  metoprolol  tartrate (LOPRESSOR ) 50 MG tablet Take 1 tablet (50 mg total) by mouth 2 (two) times daily. Patient taking differently: Take 50 mg by mouth at bedtime. 06/27/18 10/20/22  Debera Jayson MATSU, MD  naproxen  (NAPROSYN ) 500 MG tablet Take 1 tablet (500 mg total) by mouth 2 (two) times daily as needed. 03/26/22   Stuart Vernell Norris, PA-C  promethazine -dextromethorphan (PROMETHAZINE -DM) 6.25-15 MG/5ML syrup Take 5 mLs by mouth 4 (four) times daily as needed. 08/03/22   Stuart Vernell Norris, PA-C  traZODone (DESYREL) 100 MG tablet Take 100 mg by mouth at bedtime as needed for sleep. 07/09/18   [provider]  zolpidem (AMBIEN) 10 MG tablet Take 10  mg by mouth at bedtime as needed for sleep. 01/04/17   [provider]    Allergies: Patient has no known allergies.    Review of Systems  Respiratory:  Positive for shortness of breath.   Cardiovascular:  Positive for chest pain.    Updated Vital Signs BP (!) 146/87   Pulse (!) 105   Temp 98 F (36.7 C) (Oral)   Resp 18   Ht 5' (1.524 m)   Wt 75.3 kg   LMP 01/10/2012   SpO2 98%   BMI 32.42 kg/m   Physical Exam Vitals and nursing note reviewed.  Constitutional:      General: She is not in acute distress.    Appearance: She is not  toxic-appearing.  HENT:     Head: Normocephalic.  Cardiovascular:     Rate and Rhythm: Normal rate and regular rhythm.  Pulmonary:     Effort: Pulmonary effort is normal.     Breath sounds: No wheezing, rhonchi or rales.  Musculoskeletal:     Cervical back: Normal range of motion.     Right lower leg: No edema.     Left lower leg: No edema.  Skin:    General: Skin is warm.     Capillary Refill: Capillary refill takes less than 2 seconds.  Neurological:     Mental Status: She is alert and oriented to person, place, and time.  Psychiatric:        Mood and Affect: Mood normal.        Behavior: Behavior normal.     (all labs ordered are listed, but only abnormal results are displayed) Labs Reviewed  CBC - Abnormal; Notable for the following components:      Result Value   WBC 11.4 (*)    All other components within normal limits  COMPREHENSIVE METABOLIC PANEL WITH GFR - Abnormal; Notable for the following components:   Glucose, Bld 169 (*)    Alkaline Phosphatase 127 (*)    All other components within normal limits  PRO BRAIN NATRIURETIC PEPTIDE - Abnormal; Notable for the following components:   Pro Brain Natriuretic Peptide 732.0 (*)    All other components within normal limits  RESP PANEL BY RT-PCR (RSV, FLU A&B, COVID)  RVPGX2  MAGNESIUM   D-DIMER, QUANTITATIVE  TSH  TROPONIN T, HIGH SENSITIVITY    EKG: EKG Interpretation Date/Time:  Sunday June 21 2024 14:57:19 EDT Ventricular Rate:  105 PR Interval:  154 QRS Duration:  83 QT Interval:  366 QTC Calculation: 484 R Axis:   66  Text Interpretation: Sinus tachycardia Biatrial enlargement Probable anteroseptal infarct, recent Baseline wander in lead(s) V6 Confirmed by Neysa Clap [phone]) on 06/21/2024 4:03:14 PM  Radiology: [facility] Chest 2 View Result Date: 06/21/2024 CLINICAL DATA:  shob EXAM: CHEST - 2 VIEW COMPARISON:  10/20/2021. FINDINGS: Bilateral lung fields are clear. Bilateral costophrenic angles are  clear. Normal cardio-mediastinal silhouette. No acute osseous abnormalities. The soft tissues are within normal limits. IMPRESSION: No active cardiopulmonary disease. Electronically Signed   By: Ree Molt M.D.   On: 06/21/2024 13:39     Procedures   Medications Ordered in the ED  lactated ringers  bolus 1,000 mL (1,000 mLs Intravenous New Bag/Given 06/21/24 1325)    Clinical Course as of 06/21/24 1603  Sun Jun 21, 2024  1417 Echo in 2023 per chart review: IMPRESSIONS     1. Left ventricular ejection fraction, by estimation, is 70 to 75%. The  left ventricle has hyperdynamic function. The  left ventricle has no  regional wall motion abnormalities. There is severe asymmetric left  ventricular hypertrophy of the basal-septal  segment. Left ventricular diastolic parameters are consistent with Grade  II diastolic dysfunction (pseudonormalization). Elevated left atrial  pressure. Peak LVOT gradient at rest.   2. Right ventricular systolic function is normal. The right ventricular  size is normal. There is normal pulmonary artery systolic pressure. The  estimated right ventricular systolic pressure is 33.5 mmHg.   3. Left atrial size was moderately dilated.   4. The mitral valve is degenerative. Trivial mitral valve regurgitation.  No evidence of mitral stenosis. Severe mitral annular calcification.   5. The aortic valve is tricuspid. Aortic valve regurgitation is not  visualized. No aortic stenosis is present.   6. The inferior vena cava is normal in size with greater than 50%  respiratory variability, suggesting right atrial pressure of 3 mmHg.   [TY]  1419 Cardiology note in 2023: Assessment and Plan:   1.  Hypertrophic obstructive cardiomyopathy.  Last echocardiogram was in September 2020, plan will be to obtain a follow-up study.  She had a mild LVOT gradient at that time.  Does report intermittent dyspnea on exertion.  Plan will be to increase Cardizem  CD to 240 mg in the  morning and continue metoprolol  50 mg twice daily.  Can uptitrate further from there depending on how she does.  She has had no syncope.  ECG reviewed.   2.  Mixed hyperlipidemia, she continues on Lipitor with follow-up at [facility].  [TY]  1425 Heart rate down to 105 [TY]  1442 Case discussed with cardiology, will come evaluate the patient, but recommending IVFs at this time. Will make further recommendations after evaluation.  [TY]    Clinical Course User Index [TY] Neysa Caron PARAS, DO                                 Medical Decision Making 63 year old female with hypertrophic obstructive cardiomyopathy presents emergency department for shortness of breath.  She is afebrile, tachycardic in the 130s.  Appeared be sinus rhythm on the monitor as interpreted by me.  EKG also with sinus tachycardia.  Hemodynamically stable.  Does not appear to be in acute distress.  Lungs are clear.  Not requiring oxygen.  Chest x-ray without pulmonary edema or pneumonia on my independent review.  Low risk for PE based on Wells criteria.  D-dimer is negative.  Troponin negative.  BNP with mild elevation 732.  Mild leukocytosis, no fever to suggest systemic infection appears to be chronically elevated as well.  Respiratory panel negative for flu/COVID/RSV.  Case discussed with cardiology who evaluated patient and recommending further IV hydration and increasing dose of diltiazem  metoprolol  with discharge to home.  Amount and/or Complexity of Data Reviewed Independent Historian:     Details: Family member notes does have cardiac history. External Data Reviewed:     Details: See ed course.  Labs: ordered. Decision-making details documented in ED Course. Radiology: ordered and independent interpretation performed.    Details: CXR without PNX or edema.  ECG/medicine tests: ordered and independent interpretation performed.    Details: See above Discussion of management or test interpretation with external provider(s):  Per cardiology; atient has had heart rates in the 110's to 115s for most of this Summer. Suspect she have oHCM. I recommended giving her the rest of the LR running then discharge on: Diltiazem  360 mg PO  XL daily and metoprolol  succinate 150 mg PO Daily (at night). I will reach out to my team to get her an echocardiogram with our squat to stand protocol in October. I or Tessa, my PA, will see her after to discuss about treatments for [facility]. Thanks!  Risk Decision regarding hospitalization. Diagnosis or treatment significantly limited by social determinants of health. Risk Details: Poor health literacy      Final diagnoses:  None    ED Discharge Orders     None          Neysa Caron PARAS, DO 06/21/24 1602    Neysa Caron PARAS, DO 06/21/24 1603

## 2024-06-21 NOTE — Consult Note (Signed)
 Cardiology Consultation   Patient ID: Beth Li MRN: [id number]; DOB: [birth year]  Admit date: 06/21/2024 Date of Consult: 06/21/2024  PCP:  Leonce Lucie PARAS, PA-C   [facility] Providers Cardiologist:  Jayson Sierras, MD        Patient Profile: Beth Li is a 63 y.o. female with a hx of Obesity with DM and and CT evidence of PH, HLD, and HCM  who is being seen 06/21/2024 for the evaluation of tachycarida at the request of Dr. Neysa.  History of Present Illness: Beth Li notes that she is feeling worse since Tuesday.   Was last feeling well morning. Prior to 2018, Patient has no functional limitations.  No CP, SOB, DOE or syncope. Since 2018, patient has, overall felt a bit unwell.  No resting symptoms.  Patient is able to walk to get groceries, but with DOE that requires her to stop.  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  Very rare chest pain. No palpitations or syncope.  She has lately had a persistent tachycardia rates 110-115 bpm.  She can see this on her Apple Watch.  Her husband, at bedside, notes her issues with activity.   The last week she has noted resting SOB and DOE.  Rates as high as 150.  With this felt heart racing.  Apple watch did not not AF or AFL.     No weight gain, leg swelling , or abdominal swelling.  No syncope or near syncope . Notes  no palpitations or funny heart beats.   She has noted fatigue.  STOPBANG 4.  No fevers or nights sweats, chills or rigors.  Patient thought this may have been her anemia (~ 2021, Hgb 7) and came in for Evaluation.  Found to have hgb 12 but persistent tachycardia.  Father- SCD in 42s; presumed MI. Grandmother died in 86s. No clear fhx of HCM. Family has not been tested.   Past Medical History:  Diagnosis Date   Essential hypertension    GERD (gastroesophageal reflux  disease)    Hyperlipidemia    Hypertrophic cardiomyopathy (HCC)    Type 2 diabetes mellitus (HCC)     Past Surgical History:  Procedure Laterality Date   BIOPSY  10/22/2021   Procedure: BIOPSY;  Surgeon: Elicia Claw, MD;  Location: [facility];  Service: Gastroenterology;;   COLONOSCOPY N/A 10/22/2021   Procedure: COLONOSCOPY;  Surgeon: Elicia Claw, MD;  Location: [facility];  Service: Gastroenterology;  Laterality: N/A;   ESOPHAGOGASTRODUODENOSCOPY (EGD) WITH PROPOFOL  N/A 10/22/2021   Procedure: ESOPHAGOGASTRODUODENOSCOPY (EGD) WITH PROPOFOL ;  Surgeon: Elicia Claw, MD;  Location: [facility];  Service: Gastroenterology;  Laterality: N/A;   NO PAST SURGERIES      Allergies:   No Known Allergies  Social History:   Social History   Socioeconomic History   Marital status: Married    Spouse name: Not on file   Number of children: Not on file   Years of education: Not on file   Highest education level: Not on file  Occupational History   Not on file  Tobacco Use   Smoking status: Never   Smokeless tobacco: Never  Vaping Use   Vaping status: Never Used  Substance and Sexual Activity   Alcohol use: Not Currently    Comment: Occasional   Drug use: No   Sexual activity: Not on  file  Other Topics Concern   Not on file  Social History Narrative   Not on file   Social Drivers of Health   Financial Resource Strain: Not on file  Food Insecurity: Not on file  Transportation Needs: Not on file  Physical Activity: Not on file  Stress: Not on file  Social Connections: Not on file  Intimate Partner Violence: Not on file    Family History:    Family History  Problem Relation Age of Onset   Heart disease Maternal Grandmother    Congestive Heart Failure Maternal Grandmother    Heart disease Mother    Heart attack Father    Congestive Heart Failure Sister    Heart attack Paternal Grandmother      ROS:  Please see the history of present illness.     Physical Exam/Data: Vitals:   06/21/24 1158 06/21/24 1245 06/21/24 1300 06/21/24 1457  BP: (!) 163/86 (!) 148/127  (!) 146/87  Pulse: (!) 124  (!) 112 (!) 105  Resp: 19 17  18   Temp: 98 F (36.7 C)     TempSrc: Oral     SpO2: 97%  97% 98%  Weight: 75.3 kg     Height: 5' (1.524 m)      No intake or output data in the 24 hours ending 06/21/24 1556    06/21/2024   11:58 AM 10/20/2021    5:49 PM 10/04/2021    3:32 PM  Last 3 Weights  Weight (lbs) 166 lb 158 lb 165 lb 3.2 oz  Weight (kg) 75.297 kg 71.668 kg 74.934 kg     Body mass index is 32.42 kg/m.  General:  Well nourished, well developed, obese in NAD HEENT: normal Neck: no JVD Cardiac:  normal S1, S2; Regular tachycardia Lungs:  clear to auscultation bilaterally, no wheezing, rhonchi or rales  Abd: soft, nontender, no hepatomegaly  Ext: no edema Musculoskeletal:  No deformities, BUE and BLE strength normal and equal Skin: warm and dry  Neuro:  CNs 2-12 intact, no focal abnormalities noted Psych:  Normal affect   EKG:  The EKG was personally reviewed and demonstrates:  Sinus tachycardia with LVH Telemetry:  Telemetry was personally reviewed and demonstrates:  Sinus tachycardia with LVH  Laboratory Data:  Chemistry Recent Labs  Lab 06/21/24 1257  NA 135  K 4.3  CL 98  CO2 24  GLUCOSE 169*  BUN 13  CREATININE 0.88  CALCIUM  9.7  MG 2.2  GFRNONAA >60  ANIONGAP 14    Recent Labs  Lab 06/21/24 1257  PROT 7.7  ALBUMIN 4.3  AST 27  ALT 16  ALKPHOS 127*  BILITOT 0.5   Hematology Recent Labs  Lab 06/21/24 1257  WBC 11.4*  RBC 4.84  HGB 12.7  HCT 41.9  MCV 86.6  MCH 26.2  MCHC 30.3  RDW 15.2  PLT 257   BNP Recent Labs  Lab 06/21/24 1257  PROBNP 732.0*    DDimer  Recent Labs  Lab 06/21/24 1257  DDIMER <0.27    Radiology/Studies:  DG Chest 2 View Result Date: 06/21/2024 CLINICAL DATA:  shob EXAM: CHEST - 2 VIEW COMPARISON:  10/20/2021. FINDINGS: Bilateral lung fields are clear.  Bilateral costophrenic angles are clear. Normal cardio-mediastinal silhouette. No acute osseous abnormalities. The soft tissues are within normal limits. IMPRESSION: No active cardiopulmonary disease. Electronically Signed   By: Ree Molt M.D.   On: 06/21/2024 13:39     Assessment and Plan:  Hypertrophic Cardiomyopathy with query of  obstruction -  Septal variant, maximal thickness 16 mm - peak gradient 16 mm Hg only on resting assessment only in 2023   - with associated MR; with no Prior MRI - suspicion of Fabry's/Danon/Noonan's or other mimics of HCM: Long standing HTN, no Fabry's prior eval, family history of SCD, evaluation pending - Gene variant: NA (needs testing) - NYHA III - Biomarkers: pro BNP 732 - pVO2: NA  - Non HCM Contributors to disease/status HTN-  see medication plan Obesity and DM with CT evidence of moderate pulmonary artery dilation and possible PH; A1c with us  or PC-PA; would benefit from consideration of GLP-1 RA therapy - outpatient sleep study  IDA- hgm has much improved, elevated WBC is chronically elevated and may contribute to her tachycardia  Family history , Discussed family screening  Two sisters, a daughter and a son, non of whom has have family testing. - recommend genetic testing screening given FHX of SCD in father (unclear if he truly had MI; grandmother in 69s less likely to have had HCM related death)  SCD  Assessment - Needs heart monitor (two week non live indication tachycardia)\ - need outpatient CMR  Atrial fibrillation Assessment - HCM-AF score unclear; pending imaging as above  Medication symptom plan - recommend increased IVF - would increase her AV nodal therapy short term to: Diltiazem  360 mg PO XL daily and succinate 150 mg PO Daily at night - Recommend outpatient squat to stand echo then f/u with me to [employer] (later October); will ask this to be scheduled during weekday - if persistent obstructive physiology we will  discuss CMI therapy vs SRT - if we resolve her obstructive physiology and no improvement in sx; we will proceed with RHC    For questions or updates, please contact [facility] Please consult [email or web address] for contact info under    Stanly Leavens, MD FASE [facility] Cardiologist [facility]  [address] [place], [place] [address] [phone]  4:00 PM

## 2024-06-21 NOTE — Discharge Instructions (Addendum)
 Please follow-up with cardiology.  They should contact you to arrange appointment.,  However if you do not hear from them in the next day or 2 please call.  Return if develop fevers, chills, lightheadedness, passout, chest pain, worsening shortness of breath or any new or worsening symptoms that are concerning to you.

## 2024-06-22 ENCOUNTER — Telehealth: Payer: Self-pay

## 2024-06-22 ENCOUNTER — Ambulatory Visit: Attending: Internal Medicine

## 2024-06-22 DIAGNOSIS — I421 Obstructive hypertrophic cardiomyopathy: Secondary | ICD-10-CM

## 2024-06-22 DIAGNOSIS — R000 Tachycardia, unspecified: Secondary | ICD-10-CM

## 2024-06-22 NOTE — Telephone Encounter (Signed)
 Called pt reviewed MD recommendation for squat to stand echo and CMRI.   Verbally reviewed instructions.  Pt not currently active on My Chart reports knows password will review CMR instructions on my chart. Pt advised to call into our office with questions or concerns.

## 2024-06-22 NOTE — Progress Notes (Unsigned)
Enrolled for [employer] to mail a ZIO XT long term holter monitor to the patients address on file.   Dr. Chandrasekhar to read. 

## 2024-07-02 ENCOUNTER — Ambulatory Visit (HOSPITAL_COMMUNITY)
Admission: RE | Admit: 2024-07-02 | Discharge: 2024-07-02 | Disposition: A | Discharge status: Home / Self Care | Source: Ambulatory Visit | Attending: Cardiology | Admitting: Cardiology

## 2024-07-02 DIAGNOSIS — I422 Other hypertrophic cardiomyopathy: Secondary | ICD-10-CM

## 2024-07-02 DIAGNOSIS — I421 Obstructive hypertrophic cardiomyopathy: Secondary | ICD-10-CM | POA: Insufficient documentation

## 2024-07-02 LAB — ECHOCARDIOGRAM COMPLETE: S' Lateral: 2.25 cm

## 2024-07-13 ENCOUNTER — Ambulatory Visit: Payer: Self-pay

## 2024-07-13 MED ORDER — METOPROLOL SUCCINATE ER 200 MG PO TB24: 200.0000 mg | ORAL_TABLET | Freq: Every day | ORAL | 3 refills | Status: AC

## 2024-07-13 MED ORDER — DILTIAZEM HCL ER COATED BEADS 360 MG PO CP24: 360.0000 mg | ORAL_CAPSULE | Freq: Every day | ORAL | 1 refills | Status: DC

## 2024-07-13 NOTE — Telephone Encounter (Signed)
 The patient has been notified of the result and verbalized understanding.  All questions (if any) were answered. Hamp LOISE Norrie, RN 07/13/2024 4:40 PM   Pt reports is currently taking metoprolol  succinate 100 mg every day along with a 50 mg tablet of metoprolol  tartrate to equal 150 mg.  Advised pt these medications are different and pt should have taken 1.5 of metoprolol  succinate only.  Advised pt metoprolol  succinate 200 mg will be 1 pill.  Advised not to use metoprolol  tartrate any longer.   Pt request refill of diltiazem  360 mg PO every day.  Reports medication was increased while in the hospital on 06/22/24.  Will refill as requested.  Pt reports BP ranges is 130's/80's and HR goes up to 130's at times but doesn't feel it for extended time.  Has completed heart monitor and will mail in tomorrow.    Medication list updated diltiazem  180 mg and metoprolol  tartrate 50 mg have been removed from medication list. OV scheduled for 07/31/24.

## 2024-07-17 ENCOUNTER — Ambulatory Visit: Payer: Self-pay | Admitting: Internal Medicine

## 2024-07-17 DIAGNOSIS — R000 Tachycardia, unspecified: Secondary | ICD-10-CM

## 2024-07-20 ENCOUNTER — Encounter (HOSPITAL_COMMUNITY): Payer: Self-pay

## 2024-07-22 ENCOUNTER — Other Ambulatory Visit: Payer: Self-pay | Admitting: Internal Medicine

## 2024-07-22 ENCOUNTER — Ambulatory Visit (HOSPITAL_COMMUNITY)
Admission: RE | Admit: 2024-07-22 | Discharge: 2024-07-22 | Disposition: A | Discharge status: Home / Self Care | Source: Ambulatory Visit | Attending: Internal Medicine | Admitting: Internal Medicine

## 2024-07-22 DIAGNOSIS — I421 Obstructive hypertrophic cardiomyopathy: Secondary | ICD-10-CM

## 2024-07-22 MED ORDER — GADOBUTROL 1 MMOL/ML IV SOLN
10.0000 mL | Freq: Once | INTRAVENOUS | Status: AC | PRN
  Administered 2024-07-22: 10 mL via INTRAVENOUS

## 2024-07-31 ENCOUNTER — Ambulatory Visit: Attending: Internal Medicine | Admitting: Internal Medicine

## 2024-07-31 ENCOUNTER — Telehealth: Payer: Self-pay | Admitting: Pharmacist

## 2024-07-31 VITALS — BP 123/80 | HR 80 | Ht 60.0 in | Wt 170.0 lb

## 2024-07-31 DIAGNOSIS — I421 Obstructive hypertrophic cardiomyopathy: Secondary | ICD-10-CM | POA: Diagnosis not present

## 2024-07-31 NOTE — Telephone Encounter (Signed)
 Prior authorization submitted for camzyos Key: [id number]

## 2024-07-31 NOTE — Patient Instructions (Signed)
 Medication Instructions:  Your physician has recommended you make the following change in your medication:  1) STOP taking diltiazem  (Cardizem ) on 08/31/2024 2) START taking Camzyos (mavacamten) 5 mg once daily on 09/07/24  *If you need a refill on your cardiac medications before your next appointment, please call your pharmacy*  Testing/Procedures: Echocardiogram  -4 weeks (around Jan 5th), 8 weeks (around February 2nd), and 12 weeks (around March 2nd) after starting Camzyos  Follow-Up: At [facility], you and your health needs are our priority.  As part of our continuing mission to provide you with exceptional heart care, our providers are all part of one team.  This team includes your primary Cardiologist (physician) and Advanced Practice Providers or APPs (Physician Assistants and Nurse Practitioners) who all work together to provide you with the care you need, when you need it.  Your next appointment:   March 2026   Provider:   Dr. Santo

## 2024-07-31 NOTE — Progress Notes (Signed)
 Cardiology Office Note:  .    Date:  07/31/2024  ID:  Beth LABOR Li, DOB 02/24/1961, MRN [id number] PCP: Leonce Lucie Beth DEVONNA  [facility] Providers Cardiologist:  Jayson Sierras, MD     CC: HCM f/u  History of Present Illness: .    Beth Li is a 63 y.o. female with hypertrophic cardiomyopathy who presents for follow-up regarding her condition and medication management.  She has a history of hypertrophic cardiomyopathy with a septal thickness of 23 millimeters, and has been told she has a leaky mitral valve with calcification. Since her medication adjustment (highest dose dual AV nodal therapy), she initially experienced shortness of breath, which has now improved. Her heart rate, previously elevated, has improved, allowing her to perform activities like taking trash cans to the road without significant symptoms.  No chest pain, palpitations, or episodes of syncope. Her heart monitor showed normal rhythms with a brief episode of non-sustained ventricular tachycardia and some supraventricular tachycardia, but no atrial fibrillation or flutter. An MRI confirmed the thickened heart muscle and leaky valve, with some scarring.  Cannot exercise and big movements still lead to DOE.   She has one son, one brother, and two sisters who have not been tested for hypertrophic cardiomyopathy.  Discussed the use of AI scribe software for clinical note transcription with the patient, who gave verbal consent to proceed.   Relevant histories: .  Social  - one son one brother, two sisters, no SCD, no HCM ROS: As per HPI.   Studies Reviewed: .     Cardiac Studies & Procedures   ______________________________________________________________________________________________     ECHOCARDIOGRAM  ECHOCARDIOGRAM COMPLETE 07/02/2024  Narrative ECHOCARDIOGRAM REPORT    Patient Name:   Beth Li Date of Exam: 07/02/2024 Medical Rec #:  [id number]         Height:        60.0 in Accession #:    [id number]        Weight:       166.0 lb Date of Birth:  02-13-1961          BSA:          1.724 m Patient Age:    63 years          BP:           132/85 mmHg Patient Gender: F                 HR:           96 bpm. Exam Location:  [address]  Procedure: 2D Echo and Strain Analysis (Both Spectral and Color Flow Doppler were utilized during procedure).  Indications:    I42.2 HOCM  History:        Patient has prior history of Echocardiogram examinations, most recent 10/21/2021. Risk Factors:Hypertension, Diabetes and Sleep Apnea.  Sonographer:    Jon Hacker RCS Referring Phys: [id number] [facility] A Devondre Guzzetta  IMPRESSIONS   1. Severe asymmetric LVH with LVOT obstruction consistent with HOCM. LVOT gradient difficult to determine on current study, as there is at times contamination with MR signal on CW Doppler, but appears rest gradient up to and with valsalva 2. Left ventricular ejection fraction, by estimation, is 70 to 75%. The left ventricle has hyperdynamic function. The left ventricle has no regional wall motion abnormalities. There is severe asymmetric left ventricular hypertrophy of the basal-septal segment. Left ventricular diastolic parameters are indeterminate. The average left ventricular global longitudinal strain is -  20.3 %. The global longitudinal strain is normal. 3. Right ventricular systolic function is normal. The right ventricular size is normal. 4. Left atrial size was mildly dilated. 5. The mitral valve is degenerative. Mild mitral valve regurgitation. Severe mitral annular calcification. MV mean gradient at 104 bpm, suggesting moderate MS but suspect gradient elevation partially due to high flow state; unable to calculate MVA from continuity equation as unable to determine LVOT VTI due to HOCM as above 6. The aortic valve is tricuspid. Aortic valve regurgitation is not visualized. No aortic stenosis is present. 7. The  inferior vena cava is normal in size with greater than 50% respiratory variability, suggesting right atrial pressure of 3 mmHg.  FINDINGS Left Ventricle: Left ventricular ejection fraction, by estimation, is 70 to 75%. The left ventricle has hyperdynamic function. The left ventricle has no regional wall motion abnormalities. The average left ventricular global longitudinal strain is -20.3 %. Strain was performed and the global longitudinal strain is normal. The left ventricular internal cavity size was small. There is severe asymmetric left ventricular hypertrophy of the basal-septal segment. Left ventricular diastolic parameters are indeterminate.  Right Ventricle: The right ventricular size is normal. No increase in right ventricular wall thickness. Right ventricular systolic function is normal.  Left Atrium: Left atrial size was mildly dilated.  Right Atrium: Right atrial size was normal in size.  Pericardium: There is no evidence of pericardial effusion.  Mitral Valve: The mitral valve is degenerative in appearance. Severe mitral annular calcification. Mild mitral valve regurgitation. MV peak gradient, 15.5 mmHg. The mean mitral valve gradient is 7.0 mmHg.  Tricuspid Valve: The tricuspid valve is normal in structure. Tricuspid valve regurgitation is trivial.  Aortic Valve: The aortic valve is tricuspid. Aortic valve regurgitation is not visualized. No aortic stenosis is present.  Pulmonic Valve: The pulmonic valve was not well visualized. Pulmonic valve regurgitation is not visualized.  Aorta: The aortic root and ascending aorta are structurally normal, with no evidence of dilitation.  Venous: The inferior vena cava is normal in size with greater than 50% respiratory variability, suggesting right atrial pressure of 3 mmHg.  IAS/Shunts: The interatrial septum was not well visualized.   LEFT VENTRICLE PLAX 2D LVIDd:         3.29 cm LVIDs:         2.25 cm   2D Longitudinal  Strain LV PW:         1.06 cm   2D Strain GLS (A4C):   -22.1 % LV IVS:        1.50 cm   2D Strain GLS (A3C):   -17.2 % LVOT diam:     2.00 cm   2D Strain GLS (A2C):   -21.6 % LVOT Area:     3.14 cm  2D Strain GLS Avg:     -20.3 %   RIGHT VENTRICLE RV Basal diam:  2.94 cm TAPSE (M-mode): 2.0 cm RVSP:           33.9 mmHg  LEFT ATRIUM             Index        RIGHT ATRIUM           Index LA diam:        3.80 cm 2.20 cm/m   RA Pressure: 3.00 mmHg LA Vol (A2C):   55.0 ml 31.89 ml/m  RA Area:     10.80 cm LA Vol (A4C):   62.0 ml 35.95 ml/m  RA Volume:  22.30 ml  12.93 ml/m LA Biplane Vol: 60.6 ml 35.14 ml/m  AORTA Ao Root diam: 2.80 cm Ao Asc diam:  3.30 cm  MITRAL VALVE             TRICUSPID VALVE MV Peak grad: 15.5 mmHg  TR Peak grad:   30.9 mmHg MV Mean grad: 7.0 mmHg   TR Vmax:        278.00 cm/s MV Vmax:      1.97 m/s   Estimated RAP:  3.00 mmHg MV Vmean:     122.0 cm/s RVSP:           33.9 mmHg  SHUNTS Systemic Diam: 2.00 cm  Lonni Nanas MD Electronically signed by Lonni Nanas MD Signature Date/Time: 07/02/2024/5:20:48 PM    Final    MONITORS  LONG TERM MONITOR (3-14 DAYS) 07/17/2024  Narrative   Patient had a minimum heart rate of 49 bpm, maximum heart rate of 169 bpm, and average heart rate of 81 bpm. Predominant underlying rhythm was sinus rhythm One short run of NSVT < 200 bpm. Short runs of P-SVT. Isolated PACs were rare (<1.0%). Isolated PVCs were rare (<1.0%). Triggered and diary events associated with sinus rhythm.  No malignant arrhythmias of HCM.  Resting heat rate has improved.     CARDIAC MRI  MR CARDIAC MORPHOLOGY W WO CONTRAST 07/22/2024  Narrative CLINICAL DATA:  Hypertrophic cardiomyopathy suspected, further testing  EXAM: MR CARDIA MORPHOLOGY WITHOUT AND WITH CONTRAST; MR CARDIAC VELOCITY FLOW MAPPING  TECHNIQUE: The patient was scanned on a 1.5 Tesla Siemens magnet. A dedicated cardiac coil was used.  Functional imaging was done using TrueFisp sequences. 2,3, and 4 chamber views were done to assess for RWMA's. Modified Simpson's rule using a short axis stack was used to calculate an ejection fraction on a dedicated work [employer]. The patient received 10mL GADAVIST GADOBUTROL 1 MMOL/ML IV SOLN. After 10 minutes inversion recovery sequences were used to assess for infiltration and scar tissue. Phase contrast velocity encoded images obtained x 2.  This examination is tailored for evaluation cardiac anatomy and function and provides very limited assessment of noncardiac structures, which are accordingly not evaluated during interpretation. If there is clinical concern for extracardiac pathology, further evaluation with CT imaging should be considered.  FINDINGS: LEFT VENTRICLE: Normal left ventricular size. Maximum septal wall thickness: 2.3 cm. Posterior wall thickness 0.8 cm. Left ventricular internal diameter (diastole): 4.3 cm. There are no regional wall motion abnormalities. Severe asymmetric hypertrophy of the septum up to 2.3 cm. SAM of the anterior mitral valve leaflets and chords. Findings consistent with hypertrophic cardiomyopathy, reverse curve phenotype.  LV EF:  75% (Normal 52-79%)  Absolute volumes:  LV EDV: 124 mL (Normal 78-167 mL)  LV ESV: 31 mL (Normal 21-64 mL)  LV SV: 93 mL (Normal 52-114 mL)  CO: 4.9 L/min (Normal 2.7-6.3 L/min)  Indexed volumes:  LV EDV: 69 mL/sq-m (Normal 50-96 mL/sq-m)  LV ESV: 17 mL/sq-m (Normal 10-40 mL/sq-m)  LV SV: 52 mL/sq-m (Normal 33-64 mL/sq-m)  CI: 2.7 L/min/sq-m (Normal 1.9-3.9 L/min/sq-m)  RIGHT VENTRICLE: Normal right ventricular chamber size. Normal right ventricular wall thickness. Normal right ventricular systolic function. There are no regional wall motion abnormalities.  RV EF: 67% (Normal 52-80%)  Absolute volumes:  RV EDV: 117 mL (Normal 79-175 mL)  RV ESV: 38 mL (Normal 13-75  mL)  RV SV: 79 mL (Normal 56-110 mL)  CO: 4.2 L/min (Normal 2.7-6 L/min)  Indexed volumes:  RV EDV: 66  mL/sq-m (Normal 51-97 mL/sq-m)  RV ESV: 22 mL/sq-m (Normal 9-42 mL/sq-m)  RV SV: 45 mL/sq-m (Normal 35-61 mL/sq-m)  CI: 2.3 L/min/sq-m (Normal 1.8-3.8 L/min/sq-m)  Left atrium: Dilated.  Right atrium: Normal size.  Mitral valve: Normal mitral valve with moderate regurgitation, RF 23%. Mild mitral annular calcium  noted.  Aortic valve: Tricuspid aortic valve without regurgitation.  Tricuspid valve: Normal tricuspid valve with mild regurgitation.  Pulmonic valve: Normal pulmonary valve without regurgitation.  Aorta: The aortic root and proximal ascending aorta are normal in diameter.  Pulmonary artery: Dilated up to 3.0 cm consistent with pulmonary hypertension.  Pericardium: The pericardium is normal thickness. There is no pericardial effusion.  There is no evidence of intracardiac thrombus.  Systemic flow across the aortic valve (QS) was 4.6 L/min.  Pulmonary flow across the pulmonic valve (QP) was 5.0  L/min.  The QP:QS calculated across the aortic and pulmonic valves was 1.11.  Native myocardial T1-relaxation times were normal at 1028 ms.  Native myocardial T2-relaxation times were normal at 53 ms.  ECV was normal at 33%.  Delayed Enhancement: There is prominent LGE at the RV insertion points and patchy septal LGE. LGE comprises 6.5% of the LV. No evidence of infarction.  Extracardiac structures: No significant findings  IMPRESSION: 1. Severe asymmetric hypertrophy of the septum up to 2.3 cm. SAM of the mitral valve noted. Normal systolic function was normal with LVEF 75%. No regional wall motion abnormalities. Findings consistent with hypertrophic cardiomyopathy, reverse curvature phenotype.  2. The right ventricle is normal in cavity size and systolic function.  3. Moderate mitral regurgitation, RF 23%.  4. There is prominent LGE at the RV  insertion points and patchy septal LGE consistent with HCM. LGE comprises 6.5% of the LV. Normal T1/T2/ECV.  Darryle Decent, MD   Electronically Signed By: Darryle Decent M.D. On: 07/23/2024 12:15   ______________________________________________________________________________________________       Physical Exam:    VS:  BP 123/80   Pulse 80   Ht 5' (1.524 m)   Wt 170 lb (77.1 kg)   LMP 01/10/2012   SpO2 94%   BMI 33.20 kg/m    Wt Readings from Last 3 Encounters:  07/31/24 170 lb (77.1 kg)  06/21/24 166 lb (75.3 kg)  10/20/21 158 lb (71.7 kg)    Gen: no distress Cardiac: No Rubs or Gallops, holosystolic resting murmur, RRR +2 radial pulses Respiratory: Clear to auscultation bilaterally, normal effort, normal  respiratory rate GI: Soft, nontender, non-distended  MS: No  edema;  moves all extremities Integument: Skin feels warm Neuro:  At time of evaluation, alert and oriented to person/place/time/situation  Psych: Normal affect, patient feels ok     ASSESSMENT AND PLAN: .    Hypertrophic Cardiomyopathy - Reverse Curve Variant - peak gradient 83 mm Hg (Valsalva), maximal thickness 23 mm  - with calcified MAC and mixed primary MR but with LGE < 15% - suspicion of Fabry's/Danon/Noonan's or other mimics of HCM: low - Gene variant: Pending - NYHA II  Family history, Discussed family screening  - planned for genetic testing (referred- [facility])  SCD  Assessment - CMR from 2025 notable for no scar -  2 year assessment for VT on rhythm monitor showed non frequent, isolated NSVT < 200 bpm - SCD risk estimated to be 2.36 at 5 years SDM: we have discussed class III ICD indication  Atrial fibrillation Assessment - HCM-AF score 19 - Atrial arrhythmia management: normal heart monitor 10/25 save for isolated NSVT (< 200  bpm) and asympomatic SVT  Medication symptom plan - Obstructive hypertrophic cardiomyopathy with a septal thickness of 23 mm, causing decreased blood  flow and symptoms during exertion. Mitral stenosis is mild at most with a mean gradient of 7 mmHg, but mitral regurgitation is present. No severe mitral stenosis or atrial fibrillation. Increased risk for atrial fibrillation and arrhythmias. Family history of hypertrophic cardiomyopathy. - discussed norpace and ASA; I do not suspect this is the best option for her - Will start Mavacamten on December 8th, 2025. - Will stop diltiazem  on December 1st, 2025. - Will schedule echocardiograms at 4, 8, and 12 weeks after starting Mavacamten. - Provided information packet on Mavacamten, ASA, Disopyamide (AHA QR code) and myectomy - will see in March; if not a CMI responded; will consider SRT  Time:   I have spent a total of 55 minutes with the patient reviewing notes, imaging, EKGs, labs, and examining the patient as well as establishing an assessment and plan that was discussed personally with the patient. Discussed disease state education , using shared decision making tools and cardiac modeling, and genetic test. After we discussed the possibilities of SRT (myectomy) she asked to loop in her husband Ananias- we met in the ED) and he joined us  virtually discussed CMI therapy vs myectomy.    Stanly Leavens, MD FASE [facility] Cardiologist [facility]  [address] [place], [address] [place], [place] [address] [phone]  10:30 AM

## 2024-08-02 ENCOUNTER — Ambulatory Visit
Admission: EM | Admit: 2024-08-02 | Discharge: 2024-08-02 | Disposition: A | Discharge status: Home / Self Care | Attending: Family Medicine | Admitting: Family Medicine

## 2024-08-02 DIAGNOSIS — J069 Acute upper respiratory infection, unspecified: Secondary | ICD-10-CM | POA: Diagnosis not present

## 2024-08-02 LAB — POC COVID19/FLU A&B COMBO
Covid Antigen, POC: NEGATIVE
Influenza A Antigen, POC: NEGATIVE
Influenza B Antigen, POC: NEGATIVE

## 2024-08-02 MED ORDER — PROMETHAZINE-DM 6.25-15 MG/5ML PO SYRP: 5.0000 mL | ORAL_SOLUTION | Freq: Four times a day (QID) | ORAL | 0 refills | Status: AC | PRN

## 2024-08-02 MED ORDER — AZELASTINE HCL 0.1 % NA SOLN: 1.0000 | Freq: Two times a day (BID) | NASAL | 0 refills | Status: AC

## 2024-08-02 MED ORDER — ALBUTEROL SULFATE HFA 108 (90 BASE) MCG/ACT IN AERS: 2.0000 | INHALATION_SPRAY | RESPIRATORY_TRACT | 0 refills | Status: AC | PRN

## 2024-08-02 NOTE — ED Triage Notes (Signed)
 Pt reports cough, congestion, sore throat, headache, body aches, started Friday.

## 2024-08-02 NOTE — ED Provider Notes (Signed)
 [facility]    CSN: [id number] Arrival date & time: 08/02/24  0841      History   Chief Complaint No chief complaint on file.   HPI Beth Li is a 63 y.o. female.   Patient presenting today with 3-day history of progressively worsening cough with intermittent wheezing, congestion, sore throat, headache, body aches, chills.  Denies chest pain, shortness of breath, abdominal pain, vomiting, diarrhea, rashes.  So far trying over-the-counter cold and congestion medication with minimal relief.  No known history of chronic pulmonary disease.    Past Medical History:  Diagnosis Date   Essential hypertension    GERD (gastroesophageal reflux disease)    Hyperlipidemia    Hypertrophic cardiomyopathy (HCC)    Type 2 diabetes mellitus (HCC)     Patient Active Problem List   Diagnosis Date Noted   Microcytic hypochromic anemia 10/20/2021   Essential hypertension 10/20/2021   OSA (obstructive sleep apnea) 10/20/2021   Type 2 diabetes mellitus with obesity 10/20/2021    Past Surgical History:  Procedure Laterality Date   BIOPSY  10/22/2021   Procedure: BIOPSY;  Surgeon: Elicia Claw, MD;  Location: [facility];  Service: Gastroenterology;;   COLONOSCOPY N/A 10/22/2021   Procedure: COLONOSCOPY;  Surgeon: Elicia Claw, MD;  Location: [facility];  Service: Gastroenterology;  Laterality: N/A;   ESOPHAGOGASTRODUODENOSCOPY (EGD) WITH PROPOFOL  N/A 10/22/2021   Procedure: ESOPHAGOGASTRODUODENOSCOPY (EGD) WITH PROPOFOL ;  Surgeon: Elicia Claw, MD;  Location: [facility];  Service: Gastroenterology;  Laterality: N/A;   NO PAST SURGERIES      OB History   No obstetric history on file.      Home Medications    Prior to Admission medications   Medication Sig Start Date End Date Taking? Authorizing Provider  albuterol  (VENTOLIN  HFA) 108 (90 Base) MCG/ACT inhaler Inhale 2 puffs into the lungs every 4 (four) hours as needed. 08/02/24  Yes Stuart Vernell Norris, PA-C  azelastine (ASTELIN) 0.1 % nasal spray Place 1 spray into both nostrils 2 (two) times daily. Use in each nostril as directed 08/02/24  Yes Stuart Vernell Norris, PA-C  promethazine -dextromethorphan (PROMETHAZINE -DM) 6.25-15 MG/5ML syrup Take 5 mLs by mouth 4 (four) times daily as needed. 08/02/24  Yes Stuart Vernell Norris, PA-C  amphetamine-dextroamphetamine (ADDERALL) 20 MG tablet Take 20 mg by mouth daily as needed (focus). 12/13/16   [provider]  aspirin  EC 81 MG tablet Take 1 tablet (81 mg total) by mouth daily. 10/29/21   Danton Reyes DASEN, MD  atorvastatin  (LIPITOR) 40 MG tablet Take 40 mg by mouth daily. 11/30/16   [provider]  Cholecalciferol (VITAMIN D3 PO) Take 1 tablet by mouth daily.    [provider]  citalopram (CELEXA) 10 MG tablet daily.    [provider]  diltiazem  (CARDIZEM  CD) 360 MG 24 hr capsule Take 1 capsule (360 mg total) by mouth daily. 07/13/24   Chandrasekhar, Mahesh A, MD  FARXIGA 10 MG TABS tablet Take 10 mg by mouth daily.  06/23/18   [provider]  Magnesium  Gluconate 500 (27 Mg) MG TABS Take 500 mg by mouth daily.    [provider]  metFORMIN (GLUCOPHAGE) 500 MG tablet daily with breakfast.    [provider]  metoprolol  (TOPROL  XL) 200 MG 24 hr tablet Take 1 tablet (200 mg total) by mouth daily. 07/13/24   Santo Stanly DELENA, MD  traZODone (DESYREL) 100 MG tablet Take 100 mg by mouth at bedtime as needed for sleep. 07/09/18  [provider]  zolpidem (AMBIEN) 10 MG tablet Take 10 mg by mouth at bedtime as needed for sleep. 01/04/17   [provider]    Family History Family History  Problem Relation Age of Onset   Heart disease Maternal Grandmother    Congestive Heart Failure Maternal Grandmother    Heart disease Mother    Heart attack Father    Congestive Heart Failure Sister    Heart attack Paternal Grandmother     Social History Social History    Tobacco Use   Smoking status: Never   Smokeless tobacco: Never  Vaping Use   Vaping status: Never Used  Substance Use Topics   Alcohol use: Not Currently    Comment: Occasional   Drug use: No     Allergies   Patient has no known allergies.   Review of Systems Review of Systems PER HPI  Physical Exam Triage Vital Signs ED Triage Vitals  Encounter Vitals Group     BP 08/02/24 0902 (!) 148/84     Girls Systolic BP Percentile --      Girls Diastolic BP Percentile --      Boys Systolic BP Percentile --      Boys Diastolic BP Percentile --      Pulse Rate 08/02/24 0902 86     Resp 08/02/24 0902 20     Temp 08/02/24 0902 99.4 F (37.4 C)     Temp Source 08/02/24 0902 Oral     SpO2 08/02/24 0902 91 %     Weight --      Height --      Head Circumference --      Peak Flow --      Pain Score 08/02/24 0905 0     Pain Loc --      Pain Education --      Exclude from Growth Chart --    No data found.  Updated Vital Signs BP (!) 148/84 (BP Location: Right Arm)   Pulse 86   Temp 99.4 F (37.4 C) (Oral)   Resp 20   LMP 01/10/2012   SpO2 91%   Visual Acuity Right Eye Distance:   Left Eye Distance:   Bilateral Distance:    Right Eye Near:   Left Eye Near:    Bilateral Near:     Physical Exam Vitals and nursing note reviewed.  Constitutional:      Appearance: Normal appearance.  HENT:     Head: Atraumatic.     Right Ear: Tympanic membrane and external ear normal.     Left Ear: Tympanic membrane and external ear normal.     Nose: Rhinorrhea present.     Mouth/Throat:     Mouth: Mucous membranes are moist.     Pharynx: Posterior oropharyngeal erythema present.  Eyes:     Extraocular Movements: Extraocular movements intact.     Conjunctiva/sclera: Conjunctivae normal.  Cardiovascular:     Rate and Rhythm: Normal rate and regular rhythm.     Heart sounds: Normal heart sounds.  Pulmonary:     Effort: Pulmonary effort is normal.     Breath sounds: Normal  breath sounds. No wheezing or rales.  Musculoskeletal:        General: Normal range of motion.     Cervical back: Normal range of motion and neck supple.  Skin:    General: Skin is warm and dry.  Neurological:     Mental Status: She is alert and oriented to person,  place, and time.  Psychiatric:        Mood and Affect: Mood normal.        Thought Content: Thought content normal.      UC Treatments / Results  Labs (all labs ordered are listed, but only abnormal results are displayed) Labs Reviewed  POC COVID19/FLU A&B COMBO    EKG   Radiology No results found.  Procedures Procedures (including critical care time)  Medications Ordered in UC Medications - No data to display  Initial Impression / Assessment and Plan / UC Course  I have reviewed the triage vital signs and the nursing notes.  Pertinent labs & imaging results that were available during my care of the patient were reviewed by me and considered in my medical decision making (see chart for details).     Rapid flu and COVID-negative, suspect viral respiratory infection.  Exam reassuring.  Will treat with Phenergan  DM, Astelin, albuterol  as needed.  Discussed supportive home care and return precautions.  Final Clinical Impressions(s) / UC Diagnoses   Final diagnoses:  Viral URI with cough   Discharge Instructions   None    ED Prescriptions     Medication Sig Dispense Auth. Provider   albuterol  (VENTOLIN  HFA) 108 (90 Base) MCG/ACT inhaler Inhale 2 puffs into the lungs every 4 (four) hours as needed. 18 g Stuart Vernell Norris, PA-C   promethazine -dextromethorphan (PROMETHAZINE -DM) 6.25-15 MG/5ML syrup Take 5 mLs by mouth 4 (four) times daily as needed. 100 mL Stuart Vernell Norris, PA-C   azelastine (ASTELIN) 0.1 % nasal spray Place 1 spray into both nostrils 2 (two) times daily. Use in each nostril as directed 30 mL Stuart Vernell Norris, PA-C      PDMP not reviewed this encounter.   Stuart Vernell Norris, [place] 08/02/24 [phone]

## 2024-08-03 NOTE — Telephone Encounter (Signed)
Start form faxed.

## 2024-08-03 NOTE — Telephone Encounter (Signed)
 PA approved through 11/02/24

## 2024-08-06 ENCOUNTER — Ambulatory Visit
Admission: EM | Admit: 2024-08-06 | Discharge: 2024-08-06 | Disposition: A | Discharge status: Home / Self Care | Attending: Family Medicine | Admitting: Family Medicine

## 2024-08-06 ENCOUNTER — Other Ambulatory Visit: Payer: Self-pay

## 2024-08-06 DIAGNOSIS — J208 Acute bronchitis due to other specified organisms: Secondary | ICD-10-CM | POA: Diagnosis not present

## 2024-08-06 LAB — POCT RAPID STREP A (OFFICE): Rapid Strep A Screen: NEGATIVE

## 2024-08-06 MED ORDER — BUDESONIDE-FORMOTEROL FUMARATE 160-4.5 MCG/ACT IN AERO: 2.0000 | INHALATION_SPRAY | Freq: Two times a day (BID) | RESPIRATORY_TRACT | 0 refills | Status: AC

## 2024-08-06 MED ORDER — LIDOCAINE VISCOUS HCL 2 % MT SOLN: 10.0000 mL | OROMUCOSAL | 0 refills | Status: AC | PRN

## 2024-08-06 MED ORDER — DEXAMETHASONE SOD PHOSPHATE PF 10 MG/ML IJ SOLN: 10.0000 mg | Freq: Once | INTRAMUSCULAR | Status: DC

## 2024-08-06 NOTE — ED Provider Notes (Signed)
 [facility]    CSN: [id number] Arrival date & time: 08/06/24  0836      History   Chief Complaint Chief Complaint  Patient presents with   Sore Throat   Cough    HPI Beth Li is a 63 y.o. female.   Patient presenting today with ongoing 5-day history of progressively worsening sore throat, hacking cough, fatigue, congestion.  Denies chest pain, shortness of breath, vomiting, diarrhea, rashes.  Was seen 4 days ago and given albuterol , Astelin, cough syrup and states these are not providing significant relief.  Otherwise not trying anything over-the-counter for symptoms.    Past Medical History:  Diagnosis Date   Essential hypertension    GERD (gastroesophageal reflux disease)    Hyperlipidemia    Hypertrophic cardiomyopathy (HCC)    Type 2 diabetes mellitus (HCC)     Patient Active Problem List   Diagnosis Date Noted   Microcytic hypochromic anemia 10/20/2021   Essential hypertension 10/20/2021   OSA (obstructive sleep apnea) 10/20/2021   Type 2 diabetes mellitus with obesity 10/20/2021    Past Surgical History:  Procedure Laterality Date   BIOPSY  10/22/2021   Procedure: BIOPSY;  Surgeon: Elicia Claw, MD;  Location: [facility];  Service: Gastroenterology;;   COLONOSCOPY N/A 10/22/2021   Procedure: COLONOSCOPY;  Surgeon: Elicia Claw, MD;  Location: [facility];  Service: Gastroenterology;  Laterality: N/A;   ESOPHAGOGASTRODUODENOSCOPY (EGD) WITH PROPOFOL  N/A 10/22/2021   Procedure: ESOPHAGOGASTRODUODENOSCOPY (EGD) WITH PROPOFOL ;  Surgeon: Elicia Claw, MD;  Location: [facility];  Service: Gastroenterology;  Laterality: N/A;   NO PAST SURGERIES      OB History   No obstetric history on file.      Home Medications    Prior to Admission medications   Medication Sig Start Date End Date Taking? Authorizing Provider  budesonide-formoterol (SYMBICORT) 160-4.5 MCG/ACT inhaler Inhale 2 puffs into the lungs 2 (two) times daily.  Rinse mouth with water after each use 08/06/24  Yes Stuart Vernell Norris, PA-C  lidocaine  (XYLOCAINE ) 2 % solution Use as directed 10 mLs in the mouth or throat every 3 (three) hours as needed. 08/06/24  Yes Stuart Vernell Norris, PA-C  albuterol  (VENTOLIN  HFA) 108 [phone] Base) MCG/ACT inhaler Inhale 2 puffs into the lungs every 4 (four) hours as needed. 08/02/24   Stuart Vernell Norris, PA-C  amphetamine-dextroamphetamine (ADDERALL) 20 MG tablet Take 20 mg by mouth daily as needed (focus). 12/13/16   [provider]  aspirin  EC 81 MG tablet Take 1 tablet (81 mg total) by mouth daily. 10/29/21   Danton Reyes DASEN, MD  atorvastatin  (LIPITOR) 40 MG tablet Take 40 mg by mouth daily. 11/30/16   [provider]  azelastine (ASTELIN) 0.1 % nasal spray Place 1 spray into both nostrils 2 (two) times daily. Use in each nostril as directed 08/02/24   Stuart Vernell Norris, PA-C  Cholecalciferol (VITAMIN D3 PO) Take 1 tablet by mouth daily.    [provider]  citalopram (CELEXA) 10 MG tablet daily.    [provider]  diltiazem  (CARDIZEM  CD) 360 MG 24 hr capsule Take 1 capsule (360 mg total) by mouth daily. 07/13/24   Chandrasekhar, Mahesh A, MD  FARXIGA 10 MG TABS tablet Take 10 mg by mouth daily.  06/23/18   [provider]  Magnesium  Gluconate 500 (27 Mg) MG TABS Take 500 mg by mouth daily.    [provider]  metFORMIN (GLUCOPHAGE) 500 MG tablet daily with breakfast.    [provider]  metoprolol  (TOPROL  XL) 200 MG 24 hr tablet Take 1 tablet (200 mg total) by mouth daily. 07/13/24   Santo Stanly LABOR, MD  promethazine -dextromethorphan (PROMETHAZINE -DM) 6.25-15 MG/5ML syrup Take 5 mLs by mouth 4 (four) times daily as needed. 08/02/24   Stuart Vernell Norris, PA-C  traZODone (DESYREL) 100 MG tablet Take 100 mg by mouth at bedtime as needed for sleep. 07/09/18   [provider]  zolpidem (AMBIEN) 10 MG tablet Take 10 mg by mouth at  bedtime as needed for sleep. 01/04/17   [provider]    Family History Family History  Problem Relation Age of Onset   Heart disease Maternal Grandmother    Congestive Heart Failure Maternal Grandmother    Heart disease Mother    Heart attack Father    Congestive Heart Failure Sister    Heart attack Paternal Grandmother     Social History Social History   Tobacco Use   Smoking status: Never   Smokeless tobacco: Never  Vaping Use   Vaping status: Never Used  Substance Use Topics   Alcohol use: Not Currently    Comment: Occasional   Drug use: No     Allergies   Patient has no known allergies.   Review of Systems Review of Systems Per HPI  Physical Exam Triage Vital Signs ED Triage Vitals  Encounter Vitals Group     BP 08/06/24 0906 (!) 142/89     Girls Systolic BP Percentile --      Girls Diastolic BP Percentile --      Boys Systolic BP Percentile --      Boys Diastolic BP Percentile --      Pulse Rate 08/06/24 0906 93     Resp 08/06/24 0906 20     Temp 08/06/24 0906 98.2 F (36.8 C)     Temp Source 08/06/24 0906 Oral     SpO2 08/06/24 0906 91 %     Weight --      Height --      Head Circumference --      Peak Flow --      Pain Score 08/06/24 0903 8     Pain Loc --      Pain Education --      Exclude from Growth Chart --    No data found.  Updated Vital Signs BP (!) 142/89 (BP Location: Right Arm)   Pulse 93   Temp 98.2 F (36.8 C) (Oral)   Resp 20   LMP 01/10/2012   SpO2 96%   Visual Acuity Right Eye Distance:   Left Eye Distance:   Bilateral Distance:    Right Eye Near:   Left Eye Near:    Bilateral Near:     Physical Exam Vitals and nursing note reviewed.  Constitutional:      Appearance: Normal appearance.  HENT:     Head: Atraumatic.     Right Ear: Tympanic membrane and external ear normal.     Left Ear: Tympanic membrane and external ear normal.     Nose: Rhinorrhea present.     Mouth/Throat:     Mouth: Mucous  membranes are moist.     Pharynx: Posterior oropharyngeal erythema present.  Eyes:     Extraocular Movements: Extraocular movements intact.     Conjunctiva/sclera: Conjunctivae normal.  Cardiovascular:     Rate and Rhythm: Normal rate and regular rhythm.     Heart sounds: Normal heart sounds.  Pulmonary:     Effort:  Pulmonary effort is normal.     Breath sounds: Normal breath sounds. No wheezing or rales.  Musculoskeletal:        General: Normal range of motion.     Cervical back: Normal range of motion and neck supple.  Skin:    General: Skin is warm and dry.  Neurological:     Mental Status: She is alert and oriented to person, place, and time.  Psychiatric:        Mood and Affect: Mood normal.        Thought Content: Thought content normal.      UC Treatments / Results  Labs (all labs ordered are listed, but only abnormal results are displayed) Labs Reviewed  POCT RAPID STREP A (OFFICE)    EKG   Radiology No results found.  Procedures Procedures (including critical care time)  Medications Ordered in UC Medications - No data to display  Initial Impression / Assessment and Plan / UC Course  I have reviewed the triage vital signs and the nursing notes.  Pertinent labs & imaging results that were available during my care of the patient were reviewed by me and considered in my medical decision making (see chart for details).     Vital signs reassuring today, she is well-appearing in no acute distress.  Rapid strep negative, suspect still ongoing viral symptoms.  Will treat with Symbicort for bronchitis, viscous lidocaine , continue albuterol  and other supportive medications and home care.  Return for worsening or unresolving symptoms.  Final Clinical Impressions(s) / UC Diagnoses   Final diagnoses:  Viral bronchitis     Discharge Instructions      I have sent in a steroid inhaler to treat the inflammation and a numbing liquid for your throat.  Continue your  albuterol  every 4 hours as needed, Astelin nasal spray, Coricidin HBP, plain Mucinex, staying well-hydrated and follow-up for worsening or unresolving symptoms.    ED Prescriptions     Medication Sig Dispense Auth. Provider   budesonide-formoterol (SYMBICORT) 160-4.5 MCG/ACT inhaler Inhale 2 puffs into the lungs 2 (two) times daily. Rinse mouth with water after each use 1 each Stuart Vernell Norris, PA-C   lidocaine  (XYLOCAINE ) 2 % solution Use as directed 10 mLs in the mouth or throat every 3 (three) hours as needed. 100 mL Stuart Vernell Norris, [place]      PDMP not reviewed this encounter.   Stuart Vernell Norris, [place] 08/06/24 1144

## 2024-08-06 NOTE — Discharge Instructions (Signed)
 I have sent in a steroid inhaler to treat the inflammation and a numbing liquid for your throat.  Continue your albuterol  every 4 hours as needed, Astelin nasal spray, Coricidin HBP, plain Mucinex, staying well-hydrated and follow-up for worsening or unresolving symptoms.

## 2024-08-06 NOTE — ED Triage Notes (Signed)
 Pt reports having cough and sore throat that began Saturday. Pt reports being seen on Sunday. Pt reports some mucus coming up with cough. Pt denies fever since Monday. Pt reports not having much relief from cough medication.

## 2024-08-07 ENCOUNTER — Ambulatory Visit: Payer: Self-pay | Admitting: *Deleted

## 2024-08-07 ENCOUNTER — Telehealth: Payer: Self-pay | Admitting: Internal Medicine

## 2024-08-07 NOTE — Telephone Encounter (Signed)
 Pt c/o medication issue:  1. Name of Medication:   Camzyos  2. How are you currently taking this medication (dosage and times per day)?   3. Are you having a reaction (difficulty breathing--STAT)?   4. What is your medication issue?   Caller (Mitzy) stated patient has been approved for this medication but will need the patient added to the [facility] portal.

## 2024-08-07 NOTE — Telephone Encounter (Signed)
 Message sent to nurse manager

## 2024-08-11 NOTE — Telephone Encounter (Signed)
 Caller (Mitzy) called again to follow-up on the status of patient being added to [facility] portal.  Caller stated can call [facility] call center at phone# [phone] for further assistance.

## 2024-08-12 NOTE — Telephone Encounter (Signed)
 RN Sherran Louder called [phone] and spoke w/ [facility] portal rep, [place]. She confirmed that she sees patient is registered in [facility] portal - we verified patient name, DOB, and address. She states Yep, she's in there. It just looks like she's waiting to start.

## 2024-08-13 NOTE — Telephone Encounter (Signed)
 Saying they still don't see it in the portal. Please advise

## 2024-08-13 NOTE — Telephone Encounter (Signed)
 Called [facility] medication team at [phone], waited about 10 mins then had to leave a voicemail. Attempting to confirm patient name has been corrected in the system and she's now showing and ready to start.

## 2024-08-14 NOTE — Telephone Encounter (Signed)
 Beth Li is following up. She says the form will need to be resubmitted completely. She says where the inhibitors were to be listed on the form, someone wrote initiate Camzyos at 2.5 instead of naming the drug that would cause the reaction. She suggests calling the pharmacy to clarify if the medication(s) will actually cause a reaction. If medications that are not actual inhibitors are listed they also will not accept the form.  Phone# [phone] (option 3)              [phone]  Fax# [phone]

## 2024-08-18 NOTE — Telephone Encounter (Signed)
 New enrollment form faxed and uploaded

## 2024-08-18 NOTE — Telephone Encounter (Signed)
 Yes, I already faxed the corrected form.

## 2024-08-18 NOTE — Telephone Encounter (Signed)
 Hi Pharmacy team,   Are y'all helping with these specialty meds?   Please advise

## 2024-08-18 NOTE — Telephone Encounter (Signed)
 Patient should start on 5mg , there is no drug mentioned on [facility] patient enrollment forms. Please have doctor resubmitted patient form saying NO STABLE UNDER STABLE INHIBITOR ?SABRA  There has to be some reason as to why patient needs to start 2.5mg .  Can to speak with pharmacist, they are opened from 8am to 7pm.

## 2024-08-19 ENCOUNTER — Telehealth: Payer: Self-pay

## 2024-08-19 NOTE — Telephone Encounter (Addendum)
 RN received Email from Glade Gala asking RN to call and speak w/ [facility] advisor Duwaine Endo in regards to patient. The [facility] patient is ready for the Free Trial Offer, however, there is an error in the [facility] system that is preventing that from happening. Patient Beth Li. RN left message on generic voicemail asking for a call back in regards to Email request for patient issue.   Duwaine Endo called right back, she confirmed that from Email that an Enrollment Form for patient [name] (DOB 11-16-1960). We identified an error with this form and will need the following correction made in order to continue processing. The corrected form can be faxed to the [facility] Call Center at [phone]. You will receive an email confirmation when processing is complete. Summary of error(s):  Missing VLVOT checkbox response (page 3) Please reference case [id number]

## 2024-08-24 ENCOUNTER — Other Ambulatory Visit: Payer: Self-pay | Admitting: Family Medicine

## 2024-09-17 NOTE — Telephone Encounter (Signed)
 [facility] requesting a scripts for Camzyos

## 2024-09-21 NOTE — Telephone Encounter (Signed)
 Called to say they need  an actual prescription for this medication. Please advise

## 2024-09-22 MED ORDER — MAVACAMTEN 5 MG PO CAPS: 5.0000 mg | ORAL_CAPSULE | Freq: Every day | ORAL | 0 refills | Status: DC

## 2024-09-22 NOTE — Telephone Encounter (Signed)
 [facility]. Note to pharmacy not to dispense until after [facility] portal is updated after 10/05/24 echocardiogram.

## 2024-09-22 NOTE — Addendum Note (Signed)
 Addended by: RANDY HAMP SAILOR on: 09/22/2024 09:53 AM   Modules accepted: Orders

## 2024-09-30 ENCOUNTER — Telehealth: Payer: Self-pay | Admitting: Internal Medicine

## 2024-09-30 NOTE — Telephone Encounter (Signed)
 Will update portal after pt has scheduled echo on 10/05/24.

## 2024-09-30 NOTE — Telephone Encounter (Signed)
 Pt c/o medication issue:  1. Name of Medication:   mavacamten  (CAMZYOS ) 5 MG CAPS capsule   2. How are you currently taking this medication (dosage and times per day)?   3. Are you having a reaction (difficulty breathing--STAT)?   4. What is your medication issue?   Caller Adina) stated patient is part of their program but information is pending for her in their portal.  Caller stated patient's current Echocardiogram test will need to be uploaded to their portal

## 2024-10-05 ENCOUNTER — Ambulatory Visit (HOSPITAL_COMMUNITY)
Admission: RE | Admit: 2024-10-05 | Discharge: 2024-10-05 | Disposition: A | Discharge status: Home / Self Care | Source: Ambulatory Visit | Attending: Internal Medicine | Admitting: Internal Medicine

## 2024-10-05 DIAGNOSIS — I421 Obstructive hypertrophic cardiomyopathy: Secondary | ICD-10-CM | POA: Insufficient documentation

## 2024-10-05 LAB — ECHOCARDIOGRAM LIMITED
AR max vel: 1.99 cm2
AV Area VTI: 1.97 cm2
AV Area mean vel: 1.94 cm2
AV Mean grad: 6.7 mmHg
AV Peak grad: 12.2 mmHg
Ao pk vel: 1.75 m/s
Area-P 1/2: 5.29 cm2
MV M vel: 1.96 m/s
MV Peak grad: 15.4 mmHg
MV VTI: 1.83 cm2
S' Lateral: 1.8 cm

## 2024-10-06 ENCOUNTER — Telehealth: Payer: Self-pay | Admitting: Internal Medicine

## 2024-10-06 ENCOUNTER — Ambulatory Visit: Payer: Self-pay | Admitting: Internal Medicine

## 2024-10-06 NOTE — Telephone Encounter (Signed)
 Spoke with Pt. Pt stated she had seen the mychart message after she had called. Asked if she had any questions and received no answer. RN unsure if call disconnected or if pt hung up.   Written by Stanly DELENA Leavens, MD on 10/06/2024  8:58 AM EST Seen by patient Beth Li on 10/06/2024  9:16 AM

## 2024-10-06 NOTE — Telephone Encounter (Signed)
 I spoke with patient. No new medications. Thinking about taking vit c which I advised was ok. She wanted to know about grapfruit and its juice. Advised that it would be ok to eat 1/2 a grapefruit every now and then but no juice.  Will submit status form so she can get meds.

## 2024-10-06 NOTE — Telephone Encounter (Signed)
 Patient stated she was returning Dr. Milan call regarding Echocardiogram results.

## 2024-10-06 NOTE — Telephone Encounter (Signed)
 Pt returned call to follow up on Echo results please advise

## 2024-10-06 NOTE — Telephone Encounter (Signed)
 Pt c/o medication issue:  1. Name of Medication: Camvyos   2. How are you currently taking this medication (dosage and times per day)? Unknown   3. Are you having a reaction (difficulty breathing--STAT)? No   4. What is your medication issue? Pharmacy called asking for a status form for the medication   Please advise

## 2024-10-06 NOTE — Telephone Encounter (Signed)
 Pt c/o medication issue:  1. Name of Medication:  mavacamten  (CAMZYOS ) 5 MG CAPS capsule   2. How are you currently taking this medication (dosage and times per day)? Unknown   3. Are you having a reaction (difficulty breathing--STAT)? No   4. What is your medication issue? Pharmacy states vop level is less than 20 and would need clarification please advise

## 2024-10-07 ENCOUNTER — Telehealth: Payer: Self-pay | Admitting: Internal Medicine

## 2024-10-07 NOTE — Telephone Encounter (Signed)
 Concern addressed in separate encounter.  Please see encounter for more details.

## 2024-10-07 NOTE — Addendum Note (Signed)
 Addended by: RANDY HAMP SAILOR on: 10/07/2024 01:04 PM   Modules accepted: Orders

## 2024-10-07 NOTE — Telephone Encounter (Signed)
" ° °  Pharmacy calling to discuss possible drug interaction. diltiazem  (CARDIZEM  CD) 360 MG 24 hr capsule  And states recommendation to lower dosage to 2.5 mg of   mavacamten  (CAMZYOS ) 5 MG CAPS capsule  . Please advise.   [phone] "

## 2024-10-07 NOTE — Telephone Encounter (Signed)
 Concern addressed in separate encounter please see encounter for more details.

## 2024-10-07 NOTE — Telephone Encounter (Addendum)
 Called [facility]  [facility] portal to have PSF updated to reflect LVOT >30.  Called [facility] advised of correction.   Pharmacy reports has question regarding diltiazem .  Called pt reports stopped diltiazem  as directed on 08/31/24.  Called pharmacy advised pt is no longer taking medication.   Was advised medication would be shipped out to pt.

## 2024-10-08 ENCOUNTER — Telehealth: Payer: Self-pay | Admitting: Pharmacist

## 2024-10-08 NOTE — Telephone Encounter (Signed)
 PA renewal submitted via fax

## 2024-11-02 ENCOUNTER — Ambulatory Visit (HOSPITAL_COMMUNITY)

## 2024-11-05 ENCOUNTER — Ambulatory Visit (HOSPITAL_COMMUNITY): Admission: RE | Admit: 2024-11-05 | Discharge: 2024-11-05 | Attending: Internal Medicine

## 2024-11-05 DIAGNOSIS — I421 Obstructive hypertrophic cardiomyopathy: Secondary | ICD-10-CM

## 2024-11-06 ENCOUNTER — Telehealth: Payer: Self-pay | Admitting: Internal Medicine

## 2024-11-06 LAB — ECHOCARDIOGRAM LIMITED
Area-P 1/2: 2.89 cm2
S' Lateral: 1.7 cm

## 2024-11-06 MED ORDER — MAVACAMTEN 5 MG PO CAPS: 5.0000 mg | ORAL_CAPSULE | Freq: Every day | ORAL | 0 refills | Status: AC

## 2024-11-06 NOTE — Telephone Encounter (Signed)
 Pt c/o medication issue:  1. Name of Medication:   mavacamten  (CAMZYOS ) 5 MG CAPS capsule    2. How are you currently taking this medication (dosage and times per day)? As written   3. Are you having a reaction (difficulty breathing--STAT)? No  4. What is your medication issue? Pharmacy is calling to request a new prescription to be sent over and for the Cardiologist to update the PSS in the portal

## 2024-11-06 NOTE — Addendum Note (Signed)
 Addended by: RANDY HAMP SAILOR on: 11/06/2024 05:13 PM   Modules accepted: Orders

## 2024-11-06 NOTE — Telephone Encounter (Signed)
 Patient notes that she is doing much better.   Since last visit notes improved DOE.   There are no interval hospital/ED visit.   Echo showed LVEF 70%, LVOT gradient 31 mm Hg.  No chest pain or pressure .  No SOB/DOE has improved and no PND/Orthopnea.  No weight gain or leg swelling.  No palpitations or syncope.  No new medications.  NYHA II 5 mg mavacamten  dose. 12 week echo is scheduled  Stanly Leavens, MD Cardiologist [facility]  [address] [place], [address] [place], [place] [address] [phone]  5:02 PM

## 2024-11-06 NOTE — Telephone Encounter (Signed)
 [facility]  REMS portal updated.  Refill of Camzyos  5 mg sent to specialty pharmacy on file.

## 2024-11-30 ENCOUNTER — Ambulatory Visit (HOSPITAL_COMMUNITY)

## 2024-12-15 ENCOUNTER — Ambulatory Visit: Admitting: Internal Medicine
